# Patient Record
Sex: Female | Born: 1960 | Race: White | Hispanic: No | Marital: Married | Smoking: Never smoker
Health system: Southern US, Community
[De-identification: ages and names within clinical notes are randomized; demographics above are authoritative.]

## PROBLEM LIST (undated history)

## (undated) DIAGNOSIS — K121 Other forms of stomatitis: Secondary | ICD-10-CM

## (undated) DIAGNOSIS — R32 Unspecified urinary incontinence: Secondary | ICD-10-CM

## (undated) DIAGNOSIS — N766 Ulceration of vulva: Secondary | ICD-10-CM

## (undated) DIAGNOSIS — B019 Varicella without complication: Secondary | ICD-10-CM

## (undated) DIAGNOSIS — E669 Obesity, unspecified: Secondary | ICD-10-CM

## (undated) DIAGNOSIS — F419 Anxiety disorder, unspecified: Secondary | ICD-10-CM

## (undated) DIAGNOSIS — F329 Major depressive disorder, single episode, unspecified: Secondary | ICD-10-CM

## (undated) DIAGNOSIS — F32A Depression, unspecified: Secondary | ICD-10-CM

## (undated) DIAGNOSIS — K589 Irritable bowel syndrome without diarrhea: Secondary | ICD-10-CM

## (undated) DIAGNOSIS — R42 Dizziness and giddiness: Secondary | ICD-10-CM

## (undated) DIAGNOSIS — M352 Behcet's disease: Secondary | ICD-10-CM

## (undated) DIAGNOSIS — R51 Headache: Secondary | ICD-10-CM

## (undated) DIAGNOSIS — G43909 Migraine, unspecified, not intractable, without status migrainosus: Secondary | ICD-10-CM

## (undated) HISTORY — DX: Other forms of stomatitis: K12.1

## (undated) HISTORY — DX: Unspecified urinary incontinence: R32

## (undated) HISTORY — DX: Ulceration of vulva: N76.6

## (undated) HISTORY — DX: Behcet's disease: M35.2

## (undated) HISTORY — DX: Varicella without complication: B01.9

## (undated) HISTORY — DX: Obesity, unspecified: E66.9

## (undated) HISTORY — DX: Headache: R51

## (undated) HISTORY — DX: Migraine, unspecified, not intractable, without status migrainosus: G43.909

## (undated) HISTORY — DX: Anxiety disorder, unspecified: F41.9

## (undated) HISTORY — DX: Depression, unspecified: F32.A

## (undated) HISTORY — DX: Irritable bowel syndrome, unspecified: K58.9

## (undated) HISTORY — DX: Dizziness and giddiness: R42

## (undated) HISTORY — DX: Major depressive disorder, single episode, unspecified: F32.9

---
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
## 2012-02-04 HISTORY — PX: OTHER SURGICAL HISTORY: SHX169

## 2012-02-04 HISTORY — PX: LASIK: SHX215

## 2012-03-24 DIAGNOSIS — H5210 Myopia, unspecified eye: Secondary | ICD-10-CM | POA: Insufficient documentation

## 2012-03-26 DIAGNOSIS — Z9889 Other specified postprocedural states: Secondary | ICD-10-CM | POA: Insufficient documentation

## 2012-04-15 DIAGNOSIS — R9409 Abnormal results of other function studies of central nervous system: Secondary | ICD-10-CM | POA: Insufficient documentation

## 2012-04-16 ENCOUNTER — Other Ambulatory Visit: Payer: Self-pay | Admitting: Neurology

## 2012-04-16 DIAGNOSIS — R42 Dizziness and giddiness: Secondary | ICD-10-CM

## 2012-04-19 ENCOUNTER — Ambulatory Visit
Admission: RE | Admit: 2012-04-19 | Discharge: 2012-04-19 | Disposition: A | Discharge status: Home / Self Care | Payer: 59 | Source: Ambulatory Visit | Attending: Neurology | Admitting: Neurology

## 2012-04-19 VITALS — BP 93/52 | HR 84

## 2012-04-19 DIAGNOSIS — I776 Arteritis, unspecified: Secondary | ICD-10-CM

## 2012-04-19 LAB — CRYPTOCOCCAL ANTIGEN, CSF: Crypto Ag: NEGATIVE

## 2012-04-19 LAB — CSF CELL COUNT WITH DIFFERENTIAL
RBC Count, CSF: 0 cu mm
WBC, CSF: 0 cu mm (ref 0–5)

## 2012-04-19 LAB — PROTEIN, CSF: Total Protein, CSF: 37 mg/dL (ref 15–45)

## 2012-04-19 NOTE — Progress Notes (Signed)
Patient ID: Samantha Pope, female   DOB: November 01, 1960, 52 y.o.   MRN: [id number] Patient had a lumbar puncture earlier today.  Complains of headache, 4/10.  The headache continues when she lays down.  I explained to her that a headache is not unexpected after a lumbar puncture.  Recommend bed rest and continue hydration.  She will call us if the headache persists tomorrow.

## 2012-04-19 NOTE — Progress Notes (Signed)
One tube blood drawn per orders from right [facility] space without difficulty; site unremarkable.  jkl

## 2012-04-20 ENCOUNTER — Telehealth: Payer: Self-pay | Admitting: Neurology

## 2012-04-20 ENCOUNTER — Telehealth: Payer: Self-pay | Admitting: Radiology

## 2012-04-20 ENCOUNTER — Telehealth: Payer: Self-pay | Admitting: *Deleted

## 2012-04-20 NOTE — Telephone Encounter (Signed)
Pt had headache post LP but after 24 hours of bedrest and getting up no headache. Explained she may stay up, but no heavy activities today. If does not develop headache she does not need to lay down for another 24 hours.

## 2012-04-20 NOTE — Telephone Encounter (Signed)
I have already called the patient earlier today. All of the spinal fluid results are not available. So far, the cells, protein, and glucose were normal. Opening pressure was normal.

## 2012-04-20 NOTE — Telephone Encounter (Signed)
Patient called wanting know what is next after the LP.

## 2012-04-20 NOTE — Telephone Encounter (Signed)
Pt called and is asking about LP results.  Speak to Dr. Starling Manns also [phone] (or [phone]). (he was trying to get more information).

## 2012-04-20 NOTE — Telephone Encounter (Signed)
I called patient. The lumbar puncture so far looks unremarkable. Opening pressure was normal, there is no cells, or elevated protein. The patient likely does not have a vasculitis, and she certainly does not have any meningitis from Behcet's disease. 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 No specific treatment. The patient may return to work whenever the dizziness has improved to the point where she can operate a motor vehicle.

## 2012-04-22 LAB — CNS IGG SYNTHESIS RATE, CSF+BLOOD
IgG, CSF: 1.9 mg/dL (ref 0.8–7.7)
MS CNS IgG Synthesis Rate: -2.8 mg/24 h (ref <=3.3)

## 2012-04-23 ENCOUNTER — Telehealth: Payer: Self-pay | Admitting: Neurology

## 2012-04-23 NOTE — Telephone Encounter (Signed)
I called patient. The spinal fluid evaluation appear to be normal. I believe that the patient has a viral vestibulitis, and that over time that she will get better. No evidence of a meningitis, vasculitis, or encephalitis.

## 2012-04-24 LAB — OLIGOCLONAL BANDS, CSF + SERM

## 2012-05-27 ENCOUNTER — Telehealth: Payer: Self-pay | Admitting: *Deleted

## 2012-05-27 ENCOUNTER — Encounter: Payer: Self-pay | Admitting: Family Medicine

## 2012-05-27 ENCOUNTER — Ambulatory Visit (INDEPENDENT_AMBULATORY_CARE_PROVIDER_SITE_OTHER): Payer: 59 | Admitting: Family Medicine

## 2012-05-27 VITALS — BP 102/74 | HR 108 | Temp 98.2°F | Ht 67.0 in | Wt 195.0 lb

## 2012-05-27 DIAGNOSIS — G43909 Migraine, unspecified, not intractable, without status migrainosus: Secondary | ICD-10-CM

## 2012-05-27 DIAGNOSIS — H9193 Unspecified hearing loss, bilateral: Secondary | ICD-10-CM

## 2012-05-27 DIAGNOSIS — Z1211 Encounter for screening for malignant neoplasm of colon: Secondary | ICD-10-CM

## 2012-05-27 DIAGNOSIS — H8123 Vestibular neuronitis, bilateral: Secondary | ICD-10-CM

## 2012-05-27 DIAGNOSIS — H8110 Benign paroxysmal vertigo, unspecified ear: Secondary | ICD-10-CM

## 2012-05-27 DIAGNOSIS — M352 Behcet's disease: Secondary | ICD-10-CM

## 2012-05-27 DIAGNOSIS — H9190 Unspecified hearing loss, unspecified ear: Secondary | ICD-10-CM

## 2012-05-27 DIAGNOSIS — H8120 Vestibular neuronitis, unspecified ear: Secondary | ICD-10-CM

## 2012-05-27 DIAGNOSIS — K589 Irritable bowel syndrome without diarrhea: Secondary | ICD-10-CM

## 2012-05-27 MED ORDER — TOPIRAMATE 25 MG PO TABS: 25.0000 mg | ORAL_TABLET | Freq: Every day | ORAL | Status: DC

## 2012-05-27 NOTE — Patient Instructions (Addendum)
-  We have ordered labs or studies at this visit. It can take up to 1-2 weeks for results and processing. We will contact you with instructions IF your results are abnormal. Normal results will be released to your [facility]. If you have not heard from [place] or can not find your results in [facility] in 2 weeks please contact our office.  -PLEASE SIGN UP FOR MYCHART TODAY   We recommend the following healthy lifestyle measures: - eat a healthy diet consisting of lots of vegetables, fruits, beans, nuts, seeds, healthy meats such as white chicken and fish and whole grains.  - avoid fried foods, fast food, processed foods, sodas, red meet and other fattening foods.  - get a least 150 minutes of aerobic exercise per week.   -We placed a referral for you as discussed to the rheumatologist for your Behcet's, to the gastroenterologist for your colonoscopy and to ENT per your request. It usually takes about 1-2 weeks to process and schedule this referral. If you have not heard from [place] regarding this appointment in 2 weeks please contact our office.  -please schedule your mammogram  -please have any labs from your specialists faxed to me  Follow up in: 3-4 months or at your convenience for your physical exam with pap smear

## 2012-05-27 NOTE — Telephone Encounter (Signed)
This patient may need a revisit within the next 4-6 weeks. Thank you.

## 2012-05-27 NOTE — Progress Notes (Addendum)
Chief Complaint  Patient presents with  . Establish Care  . Dizziness    started feb 26    HPI:  Samantha Pope is here to establish care. Recently moved from [place] - moving since 2011. She is close to retirement. Last PCP and physical:  Has the following chronic problems and concerns today:  There is no problem list on file for this patient.  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 Starling Manns -on imuran, trental and predisone -would like to establish with rheumatology here - will see Dr. Starling Manns next week - sees him 4 times per year for labs  IBS (dx 1997): -hyocosamine  Wants to see ENT: -mild hearing loss chronic -mild chronic tinnitus and vertigo  Other providers: Integrative medicine - Dr. Adair Laundry in [facility] Rheumatology - Dr. Starling Manns Neurology -  Dr. Anne Hahn  Health Maintenance: -last physical was in may or June 2013 - normal pap (remote mild abnormal pap), mammogram dec 2012 -needs colonoscopy, had sigmoid 1997 - normal -UTD on tdap, get flu vaccine most years  ROS: See pertinent positives and negatives per HPI.  Past Medical History  Diagnosis Date  . Chicken pox   . Depression   . Headache     frequent  . Mouth ulcer   . Genital ulcer, female   . Migraines   . Urine incontinence   . IBS (irritable bowel syndrome)   . Behcet's syndrome   . Vertigo     Family History  Problem Relation Age of Onset  . Arthritis    . Prostate cancer Father   . Heart disease Father     a. fib  . Parkinson's disease Father   . Alcoholism Brother   . Colon cancer Paternal Grandfather 8  . Stroke Paternal Grandmother     in 76s    History   Social History  . Marital Status: Married    Spouse Name: N/A    Number of Children: N/A  . Years of Education:  N/A   Social History Main Topics  . Smoking status: Never Smoker   . Smokeless tobacco: None  . Alcohol Use: No  . Drug Use: None  . Sexually Active: Yes     Comment: with husabnd   Other Topics Concern  . 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, married daughters and grandsons      Spiritual Beliefs: mormon      Lifestyle: no regular exercise, diet is poor             Current outpatient prescriptions:azaTHIOprine (IMURAN) 50 MG tablet, , Disp: , Rfl: ;  Cholecalciferol (D3 DOTS) 2000 UNITS TBDP, Take by mouth., Disp: , Rfl: ;  Coenzyme Q10 (CO Q 10) 10 MG CAPS, Take 100 mg by mouth every morning., Disp: , Rfl: ;  fish oil-omega-3 fatty acids 1000 MG capsule, Take 2 g by mouth 2 (two) times daily., Disp: , Rfl: ;  hyoscyamine (LEVBID) 0.375 MG 12 hr tablet, , Disp: , Rfl:  Multiple Vitamin (MULTIVITAMIN) tablet, Take 1 tablet by mouth daily., Disp: , Rfl: ;  naproxen sodium (ANAPROX) 550 MG tablet, Take 550 mg by  mouth 2 (two) times daily with a meal., Disp: , Rfl: ;  pentoxifylline (TRENTAL) 400 MG CR tablet, Take 400 mg by mouth 2 (two) times daily., Disp: , Rfl: ;  predniSONE (DELTASONE) 20 MG tablet, , Disp: , Rfl: ;  Probiotic Product (PROBIOTIC DAILY PO), Take by mouth. Florify 5 billion cfu, Disp: , Rfl:  RESTASIS 0.05 % ophthalmic emulsion, , Disp: , Rfl: ;  Rhubarb EXTR, 4 mg by Does not apply route daily., Disp: , Rfl: ;  SUMAtriptan (IMITREX) 100 MG tablet, Take 100 mg by mouth every 2 (two) hours as needed for migraine., Disp: , Rfl:   EXAM:  Filed Vitals:   05/27/12 0828  BP: 102/74  Pulse: 108  Temp: 98.2 F (36.8 C)    Body mass index is 30.53 kg/(m^2).  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 -We provided refills for any medications we will prescribe as needed. -We addressed current concerns per orders and patient instructions. -We have asked for records for pertinent exams, studies, vaccines and notes from previous providers. -She reports she has had extensive labs with rheum and prior PCP recently and will have labs next week as well with rheum - she will have these faxed to me ->45 minutes spent face to face with this patient -follow up in June or July for physical with pap  -Patient advised to return or notify a doctor immediately if symptoms worsen or persist or new concerns arise.  Patient Instructions  -We have ordered labs or studies at this visit. It can take up to 1-2 weeks for results and processing. We will contact you with instructions IF your results are abnormal. Normal results will be released to your [facility]. If you have not heard from [place] or can not find your results in [facility] in 2 weeks please contact our office.  -PLEASE SIGN UP FOR MYCHART TODAY   We recommend the following healthy lifestyle measures: - eat a healthy diet consisting of lots of vegetables, fruits, beans, nuts, seeds, healthy meats such as white chicken  and fish and whole grains.  - avoid fried foods, fast food, processed foods, sodas, red meet and other fattening foods.  - get a least 150 minutes of aerobic exercise per week.   -We placed a referral for you as discussed to the rheumatologist for your Behcet's, to the gastroenterologist for your colonoscopy and to ENT per your request. It usually takes about 1-2 weeks to process and schedule this referral. If you have not heard from [place] regarding this appointment in 2 weeks please contact our office.  -please schedule your mammogram  -please have any labs from your specialists faxed to me  Follow up in: 3-4 months or at your convenience  for your physical exam with pap smear      Yasser Hepp R.   Recent OV notes from Dr. Anne Hahn and Dr. Starling Manns reviewed. MRI and LP ok. Has follow up with both specialist. Dx vestibular neuritis.

## 2012-05-27 NOTE — Telephone Encounter (Signed)
I called the patient. The patient has had ongoing problems with dizziness, but she is now having some headaches. The vertigo is associated with any head movement. I'll start the patient on Topamax, and I will get her into vestibular rehabilitation.

## 2012-05-27 NOTE — Telephone Encounter (Signed)
Patient called stating she is still having vertigo symptoms and would like to speak with physician.

## 2012-05-28 ENCOUNTER — Encounter: Payer: Self-pay | Admitting: Internal Medicine

## 2012-06-01 ENCOUNTER — Encounter: Payer: Self-pay | Admitting: Family Medicine

## 2012-06-03 ENCOUNTER — Other Ambulatory Visit: Payer: Self-pay

## 2012-06-03 ENCOUNTER — Encounter: Payer: Self-pay | Admitting: Family Medicine

## 2012-06-03 DIAGNOSIS — Z1231 Encounter for screening mammogram for malignant neoplasm of breast: Secondary | ICD-10-CM

## 2012-06-03 NOTE — Progress Notes (Signed)
Received OV notes from Dr. Starling Manns - prior rheumatologist.  Last OV 06/03/11. 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 Migraines. Topamax for HAs. Imuran, trental. Labs. Scanned in.

## 2012-06-04 DIAGNOSIS — Z0289 Encounter for other administrative examinations: Secondary | ICD-10-CM

## 2012-06-08 ENCOUNTER — Ambulatory Visit: Payer: 59 | Attending: Neurology | Admitting: Rehabilitative and Restorative Service Providers"

## 2012-06-08 DIAGNOSIS — H8110 Benign paroxysmal vertigo, unspecified ear: Secondary | ICD-10-CM | POA: Insufficient documentation

## 2012-06-08 DIAGNOSIS — R269 Unspecified abnormalities of gait and mobility: Secondary | ICD-10-CM | POA: Insufficient documentation

## 2012-06-08 DIAGNOSIS — IMO0001 Reserved for inherently not codable concepts without codable children: Secondary | ICD-10-CM | POA: Insufficient documentation

## 2012-06-11 ENCOUNTER — Ambulatory Visit: Payer: 59 | Admitting: Rehabilitative and Restorative Service Providers"

## 2012-06-14 ENCOUNTER — Encounter: Payer: Self-pay | Admitting: Family Medicine

## 2012-06-14 ENCOUNTER — Telehealth: Payer: Self-pay

## 2012-06-14 NOTE — Telephone Encounter (Signed)
Per Dr. Elmyra Ricks request called pt to advise that she could pick up her x-rays from her last mammograms.  Left a detailed message for pt.

## 2012-06-14 NOTE — Progress Notes (Signed)
Received some remote labs and mammogram from >5 years ago from [facility]. No useful, pertinent or recent data.

## 2012-06-15 ENCOUNTER — Telehealth: Payer: Self-pay | Admitting: Neurology

## 2012-06-15 NOTE — Telephone Encounter (Signed)
I called Samantha Pope and notified her that disability forms have been completed and faxed to [facility], per her request.

## 2012-06-18 ENCOUNTER — Telehealth: Payer: Self-pay

## 2012-06-18 ENCOUNTER — Ambulatory Visit: Payer: 59 | Admitting: Rehabilitative and Restorative Service Providers"

## 2012-06-18 NOTE — Telephone Encounter (Signed)
Per Dr. Elmyra Ricks request call pt and advised office note can be faxed to [employer] [facility] but Dr. Selena Batten did not refer patient for vestibular rehab neurology would treat this.    Called and spoke with pt and pt is aware.  Pt states she would still like last ov sent to [employer].  Pt states she has not seen neurology and she will contact them.

## 2012-06-21 ENCOUNTER — Ambulatory Visit: Payer: 59 | Admitting: Rehabilitative and Restorative Service Providers"

## 2012-06-21 ENCOUNTER — Telehealth: Payer: Self-pay | Admitting: Neurology

## 2012-06-22 NOTE — Telephone Encounter (Signed)
Sent for scan

## 2012-06-22 NOTE — Telephone Encounter (Signed)
Paper work faxed to [employer].

## 2012-06-24 ENCOUNTER — Ambulatory Visit: Payer: 59 | Admitting: Rehabilitative and Restorative Service Providers"

## 2012-06-30 ENCOUNTER — Ambulatory Visit: Payer: 59 | Admitting: Rehabilitative and Restorative Service Providers"

## 2012-07-02 ENCOUNTER — Encounter: Payer: Self-pay | Admitting: Family Medicine

## 2012-07-02 NOTE — Progress Notes (Signed)
Received some notes and labs from Dr. Judi Cong, [facility] in [place]. Pt prescribed a number of supplements and hormones in the past. Scanned in.

## 2012-07-06 ENCOUNTER — Encounter: Payer: Self-pay | Admitting: Neurology

## 2012-07-06 ENCOUNTER — Ambulatory Visit: Payer: 59 | Attending: Neurology | Admitting: Rehabilitative and Restorative Service Providers"

## 2012-07-06 DIAGNOSIS — H8110 Benign paroxysmal vertigo, unspecified ear: Secondary | ICD-10-CM | POA: Insufficient documentation

## 2012-07-06 DIAGNOSIS — IMO0001 Reserved for inherently not codable concepts without codable children: Secondary | ICD-10-CM | POA: Insufficient documentation

## 2012-07-06 DIAGNOSIS — R269 Unspecified abnormalities of gait and mobility: Secondary | ICD-10-CM | POA: Insufficient documentation

## 2012-07-07 ENCOUNTER — Telehealth: Payer: Self-pay | Admitting: Neurology

## 2012-07-07 NOTE — Telephone Encounter (Signed)
I called the patient. The patient still having some dizziness. The dizziness was better last week, a little bit worse today. The patient is also having daily headaches that she associates with certain activities such as walking or driving. The patient is trying to get her disability forms filled out due to the ongoing dizziness. I've asked her to go up on the Topamax taking 50 mg at night.

## 2012-07-07 NOTE — Telephone Encounter (Signed)
Patient called stating she is having dizziness and headache and she questions concerning her treatment.

## 2012-07-08 ENCOUNTER — Encounter: Payer: Self-pay | Admitting: Family Medicine

## 2012-07-08 ENCOUNTER — Ambulatory Visit (INDEPENDENT_AMBULATORY_CARE_PROVIDER_SITE_OTHER): Payer: 59 | Admitting: Family Medicine

## 2012-07-08 VITALS — BP 98/70 | Temp 98.2°F | Wt 201.0 lb

## 2012-07-08 DIAGNOSIS — G43909 Migraine, unspecified, not intractable, without status migrainosus: Secondary | ICD-10-CM

## 2012-07-08 DIAGNOSIS — K589 Irritable bowel syndrome without diarrhea: Secondary | ICD-10-CM | POA: Insufficient documentation

## 2012-07-08 DIAGNOSIS — F329 Major depressive disorder, single episode, unspecified: Secondary | ICD-10-CM | POA: Insufficient documentation

## 2012-07-08 DIAGNOSIS — F419 Anxiety disorder, unspecified: Secondary | ICD-10-CM | POA: Insufficient documentation

## 2012-07-08 DIAGNOSIS — R42 Dizziness and giddiness: Secondary | ICD-10-CM

## 2012-07-08 DIAGNOSIS — M352 Behcet's disease: Secondary | ICD-10-CM

## 2012-07-08 DIAGNOSIS — F341 Dysthymic disorder: Secondary | ICD-10-CM

## 2012-07-08 NOTE — Progress Notes (Signed)
No chief complaint on file.   HPI:  52 yo female with PMH Behcet's (referred to rheuma), Headaches and vertigo (followed by neuro) and a hx of depression her for acute visit for depression. -depression worse recently, feels like memory is not where it needs to be, feels like cognition is not all there - feels like in a fog, anxious a lot, sleep is poor, has trouble remembering things, feels like coping mechanisms have been failing, reports kids have told her she has ADD, feels like mood is unstable, cries frequently, depressed mood daily -she has seen counselor in the past and wants to see psych in the area -denies: SI, thoughts of self harm -still having frequent headaches and intermittent dizziness with sudden movements - she spoke with her neurologist yesterday and doing trial of double dose of her topamax, and she is undergoing vestibular rehab and her neruologist is filling out her paperwork for the vestibular neuritis for disability -she missed recent appt with rheum here, but sees rheum in [place] this week and will have labs done there, sees rheum here in a few weeks  Health Maintenance: -mammo in June scheduled -colonoscopy in July scheduled  ROS: See pertinent positives and negatives per HPI.  Past Medical History  Diagnosis Date  . Chicken pox   . Depression   . Headache(784.0)     frequent  . Mouth ulcer   . Genital ulcer, female   . Migraines   . Urine incontinence   . IBS (irritable bowel syndrome)   . Behcet's syndrome   . Vertigo     Family History  Problem Relation Age of Onset  . Arthritis    . Prostate cancer Father   . Heart disease Father     a. fib  . Parkinson's disease Father   . Alcoholism Brother   . Colon cancer Paternal Grandfather 21  . Stroke Paternal Grandmother     in 5s    History   Social History  . Marital Status: Married    Spouse Name: N/A    Number of Children: N/A  . Years of Education: N/A   Social History Main Topics  .  Smoking status: Never Smoker   . Smokeless tobacco: None  . Alcohol Use: No  . Drug Use: None  . Sexually Active: Yes     Comment: with husabnd   Other Topics Concern  . 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, married daughters and grandsons      Spiritual Beliefs: mormon      Lifestyle: no regular exercise, diet is poor             Current outpatient prescriptions:azaTHIOprine (IMURAN) 50 MG tablet, , Disp: , Rfl: ;  Cholecalciferol (D3 DOTS) 2000 UNITS TBDP, Take by mouth., Disp: , Rfl: ;  Coenzyme Q10 (CO Q 10) 10 MG CAPS, Take 100 mg by mouth every morning., Disp: , Rfl: ;  fish oil-omega-3 fatty acids 1000 MG capsule, Take 2 g by mouth 2 (two) times daily., Disp: , Rfl: ;  hyoscyamine (LEVBID) 0.375 MG 12 hr tablet, , Disp: , Rfl:  Multiple Vitamin (MULTIVITAMIN) tablet, Take 1 tablet by mouth daily., Disp: , Rfl: ;  naproxen sodium (ANAPROX) 550 MG tablet, Take 550 mg by mouth 2 (two) times daily with a meal., Disp: ,  Rfl: ;  pentoxifylline (TRENTAL) 400 MG CR tablet, Take 400 mg by mouth every evening. , Disp: , Rfl: ;  predniSONE (DELTASONE) 20 MG tablet, Take 17.5 mg by mouth daily. , Disp: , Rfl:  Probiotic Product (PROBIOTIC DAILY PO), Take by mouth. Florify 5 billion cfu, Disp: , Rfl: ;  RESTASIS 0.05 % ophthalmic emulsion, , Disp: , Rfl: ;  Rhubarb EXTR, 4 mg by Does not apply route daily., Disp: , Rfl: ;  SUMAtriptan (IMITREX) 100 MG tablet, Take 100 mg by mouth every 2 (two) hours as needed for migraine., Disp: , Rfl: ;  topiramate (TOPAMAX) 25 MG tablet, Take 50 mg by mouth at bedtime., Disp: , Rfl:   EXAM:  Filed Vitals:   07/08/12 1508  BP: 98/70  Temp: 98.2 F (36.8 C)    Body mass index is 31.47 kg/(m^2).  GENERAL: vitals reviewed and listed above, alert, oriented, appears well hydrated and in no acute  distress  HEENT: atraumatic, conjunttiva clear, no obvious abnormalities on inspection of external nose and ears  MS: moves all extremities without noticeable abnormality  PSYCH: pleasant and cooperative, depressed mood, flat affect, tearful  ASSESSMENT AND PLAN:  Discussed the following assessment and plan:  Anxiety and depression -obvious depression, no thoughts of self harm or SI -discussed tx options -she would prefer to see psych - information provided to call for psych and counseling -return precautions  Migraine -followed by her neurologist, recent increase in Topamax  Vertigo -followed by neurology and has had imaging and LP, told vestibular neuritis, undergoing vestibular rehab -ask her to have any changes in meds, tests and OV faxed to [place] -she has been somewhat frustrated with ability to have appts with her neurologist and chronicity of HAs and dizziness. Offered referral to neuro at medical center - she deferred this for now and will continue with current neurologist.   IBS (irritable bowel syndrome)  Behcet's syndrome -has follow up with rheum in [place] this week with lab work, establishing with rheum here later this month -asked her to have any labs, OV, changes in medication faxed to [place]   -Patient advised to return or notify a doctor immediately if symptoms worsen or persist or new concerns arise.  There are no Patient Instructions on file for this visit.   Kriste Basque R.

## 2012-07-09 ENCOUNTER — Ambulatory Visit: Payer: 59 | Admitting: Rehabilitative and Restorative Service Providers"

## 2012-07-12 ENCOUNTER — Ambulatory Visit: Payer: 59 | Admitting: Rehabilitative and Restorative Service Providers"

## 2012-07-13 ENCOUNTER — Encounter: Payer: Self-pay | Admitting: Family Medicine

## 2012-07-13 ENCOUNTER — Telehealth: Payer: Self-pay | Admitting: Neurology

## 2012-07-13 NOTE — Telephone Encounter (Signed)
I spoke to patient and she faxed over more forms on 06-22-12 and then again yesterday.  I don't see that we have received these forms.  She will fax tomorrow to our direct fax and I will check with medical records to see if these have been received.

## 2012-07-13 NOTE — Progress Notes (Signed)
Annual exam on 09/2011 from [facility], [place], [place]. No labs or studies done. Report in OV note UTD with mammo and pap as of 2012 and last tetanus 2010. Per  OV notes, pt had just changed PCP as unhappy with prior office.

## 2012-07-13 NOTE — Telephone Encounter (Signed)
Patient is calling back again to tell us how important it is she gets her disability paper work faxed in today.  She's had contact with our practice and states we have the fax number and the information which should go to her disability case manager.  You can reach the patient at [phone]. Patient seems upset and would like a call back today if at all possible,

## 2012-07-14 ENCOUNTER — Telehealth: Payer: Self-pay | Admitting: Neurology

## 2012-07-14 NOTE — Telephone Encounter (Signed)
Forms faxed and received this am, placed on Lupita Leash RN's desk.

## 2012-07-14 NOTE — Telephone Encounter (Signed)
I spoke to patient and told her I received forms.

## 2012-07-15 ENCOUNTER — Ambulatory Visit (INDEPENDENT_AMBULATORY_CARE_PROVIDER_SITE_OTHER): Payer: Managed Care, Other (non HMO) | Admitting: Licensed Clinical Social Worker

## 2012-07-15 DIAGNOSIS — F331 Major depressive disorder, recurrent, moderate: Secondary | ICD-10-CM

## 2012-07-15 DIAGNOSIS — F411 Generalized anxiety disorder: Secondary | ICD-10-CM

## 2012-07-16 ENCOUNTER — Ambulatory Visit: Payer: 59 | Admitting: Rehabilitative and Restorative Service Providers"

## 2012-07-16 ENCOUNTER — Telehealth: Payer: Self-pay | Admitting: Neurology

## 2012-07-16 NOTE — Telephone Encounter (Signed)
I left a follow up voice message for patient: The initial physician statement is dated 04/02/2012. We did nto see patient prior to March 2014. Please contact Mickey Farber, RN with specifics about that you are requesting. I continued, the letter patient sent is asking that we contact [employer] [facility] but, there in no phone number, only fax numbers. Please call and leave a message with Ms. McCaw, RN re: Counsellor.

## 2012-07-16 NOTE — Telephone Encounter (Signed)
The patient faxed over a letter with her disability asking numerous questions about her medicine and condition.  She said the doctor told her to increase her Topamax 25mg  to 3 or 4 pills.  She read up on Topamax and about side effects such as confusion, concentration, depression etc.  She said that during her illness she has had cognitive impairment, with slowness of thought, speech, but recently  weepy, upset, forgetfulness, tired and many more.   The downturn started 3-4 weeks after starting the Topamax. She is concerned that the Topamax may be causing more harm than good.  She asked:  Is there another way to treat these headaches? What is the safe way to taper off the Topamax?  Is there anyway to know if the medication is causing the symptoms or if there is something else going on?   Her number [phone].

## 2012-07-19 ENCOUNTER — Telehealth: Payer: Self-pay | Admitting: Neurology

## 2012-07-19 ENCOUNTER — Ambulatory Visit
Admission: RE | Admit: 2012-07-19 | Discharge: 2012-07-19 | Disposition: A | Discharge status: Home / Self Care | Payer: 59 | Source: Ambulatory Visit

## 2012-07-19 DIAGNOSIS — Z1231 Encounter for screening mammogram for malignant neoplasm of breast: Secondary | ICD-10-CM

## 2012-07-19 NOTE — Telephone Encounter (Signed)
I spoke to patient and she finished her Topamax last night, was taking 50mg  a day.  She wants to know if she should have it refilled, or was she to taper off.  Also please see note from below with all the questions about Topamax.  I also told her we will not fill out the disability paperwork until she is seen again.  [phone].

## 2012-07-19 NOTE — Telephone Encounter (Signed)
I called patient. The patient indicates that she is not gaining much benefit with the Topamax. She is to go off the medication, and we will see her back in office in several weeks.

## 2012-07-19 NOTE — Telephone Encounter (Signed)
States she is returning [employer].   She asks that you call Milus Banister at [phone], she is with [employer].  Wouldn't give details on call states that Lupita Leash will know.

## 2012-07-20 ENCOUNTER — Ambulatory Visit: Payer: 59 | Admitting: Rehabilitative and Restorative Service Providers"

## 2012-07-20 ENCOUNTER — Ambulatory Visit (INDEPENDENT_AMBULATORY_CARE_PROVIDER_SITE_OTHER): Payer: 59 | Admitting: Family Medicine

## 2012-07-20 ENCOUNTER — Encounter: Payer: Self-pay | Admitting: Family Medicine

## 2012-07-20 VITALS — BP 102/72 | Temp 98.8°F | Wt 196.0 lb

## 2012-07-20 DIAGNOSIS — F329 Major depressive disorder, single episode, unspecified: Secondary | ICD-10-CM

## 2012-07-20 DIAGNOSIS — G44209 Tension-type headache, unspecified, not intractable: Secondary | ICD-10-CM

## 2012-07-20 DIAGNOSIS — F3289 Other specified depressive episodes: Secondary | ICD-10-CM

## 2012-07-20 NOTE — Progress Notes (Addendum)
Chief Complaint  Patient presents with  . Headache    fagitue    HPI:  Acute visit for headache: -long hx headache, fatigue, depression, migraines - followed by neurology and rheum, optho and integrative medical doctor -having tension headache that started today, has had headaches frequently for many months followed by neurologist and on disability for this and ? Vestibular neuritis for several months -reports stopped her topamax yesterday because she didn't think it helped- reports told neurologist about this, has appt in July for follow up with neuro -pt is wanting continued disability for her headache because feels like thinking wears her out and makes her head hurt - but reports neurologist does not feel headaches warrant continued disability -has tried naproxen, Excedrin migraine, Topamax, Imitrex (some helped but doesn't like side effects) - reluctant to try other medications, headaches unchanged -she also struggles with depression - recently seen for this and refused tx here for this and preferred to see psych - was provided info to schedule appt with pscyh, denied SI, self harm  ROS: See pertinent positives and negatives per HPI.  Past Medical History  Diagnosis Date  . Chicken pox   . Depression   . Headache(784.0)     frequent  . Mouth ulcer   . Genital ulcer, female   . Migraines   . Urine incontinence   . IBS (irritable bowel syndrome)   . Behcet's syndrome   . Vertigo     Family History  Problem Relation Age of Onset  . Arthritis    . Prostate cancer Father   . Heart disease Father     a. fib  . Parkinson's disease Father   . Alcoholism Brother   . Colon cancer Paternal Grandfather 36  . Stroke Paternal Grandmother     in 62s    History   Social History  . Marital Status: Married    Spouse Name: N/A    Number of Children: N/A  . Years of Education: N/A   Social History Main Topics  . Smoking status: Never Smoker   . Smokeless tobacco: None  .  Alcohol Use: No  . Drug Use: None  . Sexually Active: Yes     Comment: with husabnd   Other Topics Concern  . 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, married daughters and grandsons      Spiritual Beliefs: mormon      Lifestyle: no regular exercise, diet is poor             Current outpatient prescriptions:azaTHIOprine (IMURAN) 50 MG tablet, , Disp: , Rfl: ;  Cholecalciferol (D3 DOTS) 2000 UNITS TBDP, Take by mouth., Disp: , Rfl: ;  Coenzyme Q10 (CO Q 10) 10 MG CAPS, Take 100 mg by mouth every morning., Disp: , Rfl: ;  fish oil-omega-3 fatty acids 1000 MG capsule, Take 2 g by mouth 2 (two) times daily., Disp: , Rfl: ;  hyoscyamine (LEVBID) 0.375 MG 12 hr tablet, , Disp: , Rfl:  Multiple Vitamin (MULTIVITAMIN) tablet, Take 1 tablet by mouth daily., Disp: , Rfl: ;  naproxen sodium (ANAPROX) 550 MG tablet, Take 550 mg by mouth 2 (two) times daily with a meal., Disp: , Rfl: ;  pentoxifylline (TRENTAL) 400 MG CR tablet, Take 400 mg by mouth every evening. , Disp: , Rfl: ;  predniSONE (DELTASONE) 20 MG tablet, Take 17.5 mg by mouth daily. , Disp: , Rfl:  Probiotic Product (PROBIOTIC DAILY PO), Take by mouth. Florify 5 billion cfu, Disp: , Rfl: ;  RESTASIS 0.05 % ophthalmic emulsion, , Disp: , Rfl: ;  Rhubarb EXTR, 4 mg by Does not apply route daily., Disp: , Rfl: ;  SUMAtriptan (IMITREX) 100 MG tablet, Take 100 mg by mouth every 2 (two) hours as needed for migraine., Disp: , Rfl:   EXAM:  Filed Vitals:   07/20/12 0954  BP: 102/72  Temp: 98.8 F (37.1 C)    Body mass index is 30.69 kg/(m^2).  GENERAL: vitals reviewed and listed above, alert, oriented, appears well hydrated and in no acute distress  HEENT: atraumatic, conjunttiva clear, no obvious abnormalities on inspection of external nose and ears  NECK: no obvious masses on  inspection  LUNGS: clear to auscultation bilaterally, no wheezes, rales or rhonchi, good air movement  CV: HRRR, no peripheral edema  MS: moves all extremities without noticeable abnormality  PSYCH: pleasant and cooperative, depressed mood - tearful when I agreed with neurology recs regarding continued disability.  NEURO: CN II-XII grossly intact, PERRLA, finger to nose normal, normal gait  ASSESSMENT AND PLAN:  Discussed the following assessment and plan:  Tension headache/migraines/dizziness -seems unhappy with tx per neuro, not happy they have not recommended long term disability for this -I advised would not override neurology decision and feel getting back to usual activities/work may actually help as being out of work has not seemed to help -she is not willing to try other suggestions I made for prophylactic tx for migraines/tension HA (BB, TCA, etc) -[facility], perhaps other options, she is not interested in this -provided CAM options, supplement info (butterbur, feverfew, etc - risks/benefits, discussed supplements not regulated in the [place]) -advised follow up with neuro  Depression -she is to see psych about her depression as not interested in options I presented at last visit. I feel she needs psych care and counseling. -I feel getting back to usual activities and work will be helpful  have asked her to have labs/studies sent to [place] when seen by her specialists -advised her to contact us for any other concerns -Patient advised to return or notify a doctor immediately if symptoms worsen or persist or new concerns arise. >25 minutes spent face to face with this patient  Note: I do not feel I have been able to help this patient much as she is reluctant to follow any recommendations I have She seems primarily interested in getting long term disability, but I do not feel this is warranted nor that being out of work has helped her.  I actually feel being out of work may worsen and prolong her depression and other issues.   Patient Instructions  Chauncey Mann: Phone: [phone] Address: [address], [place], [place] [address]  [facility]: [address] [place], [place] [address] [phone]  Dr. Mia Creek [facility] [address] [place], [place] [phone]   Follow up with your neurologist as scheduled for your headaches      Terressa Koyanagi.

## 2012-07-20 NOTE — Patient Instructions (Addendum)
Chauncey Mann: Phone: [phone] Address: [address], [place], [place] [address]  [facility]: [address] [place], [place] [address] [phone]  Dr. Mia Creek [facility] [address] [place], [place] [phone]   Follow up with your neurologist as scheduled for your headaches

## 2012-07-23 ENCOUNTER — Ambulatory Visit: Payer: 59 | Admitting: Rehabilitative and Restorative Service Providers"

## 2012-07-23 ENCOUNTER — Encounter: Payer: Self-pay | Admitting: Family Medicine

## 2012-07-23 NOTE — Progress Notes (Signed)
Received some OV notes from prior doctor, Dr. Deitra Mayo. Appears had frequent visits in 2013 for migraines and IBS, requesting disability, notes handwritten and difficult to read - unclear whether or not on disability. Also received labs from 05/2012 including CBC, cortisol, CRP, homocysteine, cardiolipin antibodies - all normal. Also CBC and CMp and lipids from 2013 looked ok.  Pap reports from 12/2010 and 12/2009 normal.  Scanned in.

## 2012-07-27 ENCOUNTER — Encounter: Payer: 59 | Admitting: Rehabilitative and Restorative Service Providers"

## 2012-07-30 ENCOUNTER — Ambulatory Visit: Payer: 59 | Admitting: Rehabilitative and Restorative Service Providers"

## 2012-08-03 ENCOUNTER — Encounter: Payer: Self-pay | Admitting: Family Medicine

## 2012-08-03 NOTE — Progress Notes (Signed)
Rheum notes from OV with Dr. Dierdre Forth on 08/02/12 received and reviewed. Stable behcet's. F/u with Dr. Dierdre Forth in 2 months. Scanned in.

## 2012-08-09 ENCOUNTER — Encounter: Payer: 59 | Admitting: Internal Medicine

## 2012-08-12 ENCOUNTER — Encounter: Payer: Self-pay | Admitting: Internal Medicine

## 2012-08-26 ENCOUNTER — Encounter: Payer: Self-pay | Admitting: Family Medicine

## 2012-08-26 ENCOUNTER — Encounter: Payer: Self-pay | Admitting: Neurology

## 2012-08-26 ENCOUNTER — Other Ambulatory Visit (HOSPITAL_COMMUNITY)
Admission: RE | Admit: 2012-08-26 | Discharge: 2012-08-26 | Disposition: A | Discharge status: Home / Self Care | Payer: 59 | Source: Ambulatory Visit | Attending: Family Medicine | Admitting: Family Medicine

## 2012-08-26 ENCOUNTER — Ambulatory Visit (INDEPENDENT_AMBULATORY_CARE_PROVIDER_SITE_OTHER): Payer: 59 | Admitting: Family Medicine

## 2012-08-26 VITALS — BP 100/74 | Temp 98.2°F | Wt 199.0 lb

## 2012-08-26 DIAGNOSIS — Z1151 Encounter for screening for human papillomavirus (HPV): Secondary | ICD-10-CM | POA: Insufficient documentation

## 2012-08-26 DIAGNOSIS — Z01419 Encounter for gynecological examination (general) (routine) without abnormal findings: Secondary | ICD-10-CM | POA: Insufficient documentation

## 2012-08-26 DIAGNOSIS — F419 Anxiety disorder, unspecified: Secondary | ICD-10-CM

## 2012-08-26 DIAGNOSIS — G43909 Migraine, unspecified, not intractable, without status migrainosus: Secondary | ICD-10-CM

## 2012-08-26 DIAGNOSIS — I776 Arteritis, unspecified: Secondary | ICD-10-CM

## 2012-08-26 DIAGNOSIS — Z0000 Encounter for general adult medical examination without abnormal findings: Secondary | ICD-10-CM

## 2012-08-26 DIAGNOSIS — F341 Dysthymic disorder: Secondary | ICD-10-CM

## 2012-08-26 DIAGNOSIS — R42 Dizziness and giddiness: Secondary | ICD-10-CM

## 2012-08-26 DIAGNOSIS — R9409 Abnormal results of other function studies of central nervous system: Secondary | ICD-10-CM

## 2012-08-26 LAB — BASIC METABOLIC PANEL
BUN: 21 mg/dL (ref 6–23)
CO2: 28 mEq/L (ref 19–32)
Chloride: 108 mEq/L (ref 96–112)
Creatinine, Ser: 0.9 mg/dL (ref 0.4–1.2)
Potassium: 4.1 mEq/L (ref 3.5–5.1)

## 2012-08-26 LAB — LIPID PANEL
HDL: 71.3 mg/dL (ref >39.00)
Total CHOL/HDL Ratio: 3
Triglycerides: 111 mg/dL (ref 0.0–149.0)

## 2012-08-26 NOTE — Patient Instructions (Signed)
-  We have ordered labs or studies at this visit. It can take up to 1-2 weeks for results and processing. We will contact you with instructions IF your results are abnormal. Normal results will be released to your [facility]. If you have not heard from [place] or can not find your results in [facility] in 2 weeks please contact our office.  -please make sure to get your colonoscopy in September  -follow up yearly for physical and as needed

## 2012-08-26 NOTE — Progress Notes (Signed)
Chief Complaint  Patient presents with  . Annual Exam    HPI:  Here for CPE:  -Concerns today: none, continued but somewhat mild HAs and depression, seeing her neurologist tomorrow, reports symptoms manageable, tapering down on prednisone Parents in car accident and serious health issues, has been taking care of them.  -Diet: variety of foods, balance and well rounded, larger portion sizes  -vit d/calcium: no  -Exercise: no regular exercise  -Diabetes and Dyslipidemia Screening: lipids, CMP, CBC ith Dr. Deitra Mayo normal  -Hx of HTN: no  -Vaccines: UTD  -pap history: from prior PCP records (Dr. Deitra Mayo) paps normal 2011 and 2012  -FDLMP: post menopausal, no bleeding  -sexual activity: yes, female partner, no new partners  -wants STI testing: no  -FH breast, colon or ovarian ca: see FH -mammo 07/2012 birads 1 -was scheduled for colonoscopy in September  -Alcohol, Tobacco, drug use: see social history  Review of Systems - unchanged. See above.  Past Medical History  Diagnosis Date  . Chicken pox   . Depression   . Headache(784.0)     frequent  . Mouth ulcer   . Genital ulcer, female   . Migraines   . Urine incontinence   . IBS (irritable bowel syndrome)   . Behcet's syndrome   . Vertigo     Family History  Problem Relation Age of Onset  . Arthritis    . Prostate cancer Father   . Heart disease Father     a. fib  . Parkinson's disease Father   . Alcoholism Brother   . Colon cancer Paternal Grandfather 67  . Stroke Paternal Grandmother     in 98s    History   Social History  . Marital Status: Married    Spouse Name: N/A    Number of Children: N/A  . Years of Education: N/A   Social History Main Topics  . Smoking status: Never Smoker   . Smokeless tobacco: None  . Alcohol Use: No  . Drug Use: None  . Sexually Active: Yes     Comment: with husabnd   Other Topics Concern  . 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 , Disp: , Rfl: ;  Cholecalciferol (D3 DOTS) 2000 UNITS TBDP, Take 2,000 Units by mouth 2 (two) times daily. , Disp: , Rfl: ;  COCONUT OIL PO, Take by mouth., Disp: , Rfl: ;  Coenzyme Q10 (CO Q 10) 10 MG CAPS, Take 100 mg by mouth every morning., Disp: , Rfl:  fish oil-omega-3 fatty acids 1000 MG capsule, Take 2 g by mouth 2 (two) times daily., Disp: , Rfl: ;  hyoscyamine (LEVBID) 0.375 MG 12 hr tablet, Take 0.375 mg by mouth every 12 (twelve) hours as needed. , Disp: , Rfl: ;  Multiple Vitamin (MULTIVITAMIN) tablet, Take 1 tablet by mouth daily., Disp: , Rfl: ;  naproxen sodium (ANAPROX) 550 MG tablet, Take 550 mg by mouth 2 (two) times daily with a meal., Disp: , Rfl:  pentoxifylline (TRENTAL) 400 MG CR tablet, Take 400 mg  by mouth 2 (two) times daily. , Disp: , Rfl: ;  predniSONE (DELTASONE) 20 MG tablet, Take 15 mg by mouth daily. , Disp: , Rfl: ;  Probiotic Product (PROBIOTIC DAILY PO), Take by mouth. Florify 5 billion cfu, Disp: , Rfl: ;  RESTASIS 0.05 % ophthalmic emulsion, , Disp: , Rfl: ;  Rhubarb EXTR, 4 mg by Does not apply route daily., Disp: , Rfl:  SUMAtriptan (IMITREX) 100 MG tablet, Take 100 mg by mouth every 2 (two) hours as needed for migraine., Disp: , Rfl:   EXAM:  Filed Vitals:   08/26/12 0931  BP: 100/74  Temp: 98.2 F (36.8 C)    GENERAL: vitals reviewed and listed below, alert, oriented, appears well hydrated and in no acute distress  HEENT: head atraumatic, PERRLA, normal appearance of eyes, ears, nose and mouth. moist mucus membranes.  NECK: supple, no masses or lymphadenopathy  LUNGS: clear to auscultation bilaterally,  no rales, rhonchi or wheeze  CV: HRRR, no peripheral edema or cyanosis, normal pedal pulses  BREAST: normal appearance - no lesions or discharge, on palpation normal breast tissue without any suspicious masses  ABDOMEN: bowel sounds normal, soft, non tender to palpation, no masses, no rebound or guarding  GU: normal appearance of external genitalia with mild erythema of vulva - no lesions or masses, normal vaginal mucosa - no abnormal discharge, normal appearance of cervix - no lesions or abnormal discharge, no masses or tenderness on palpation of uterus and ovaries.  RECTAL: deferred  SKIN: no rash or abnormal lesions  MS: normal gait, moves all extremities normally  NEURO: CN II-XII grossly intact, normal muscle strength and sensation to light touch on extremities  PSYCH: normal affect, pleasant and cooperative  ASSESSMENT AND PLAN:  Discussed the following assessment and plan:  Visit for preventive health examination - Plan: Lipid Panel, Hemoglobin A1c, Basic metabolic panel, Cytology - PAP  Anxiety and depression  Migraine   -Discussed and advised all [place] preventive services health task force level A and B recommendations for age, sex and risks.  -Advised at least 150 minutes of exercise per week and a healthy diet low in saturated fats and sweets and consisting of fresh fruits and vegetables, lean meats such as fish and white chicken and whole grains.  -FASTING LABS today, on prednisone -she has colonsocopy scheduled for September -mechanical fall, doing ok -chronic depression, improving -pap with hpv  -labs, studies and vaccines per orders this encounter  Orders Placed This Encounter  Procedures  . Lipid Panel  . Hemoglobin A1c  . Basic metabolic panel    There are no Patient Instructions on file for this visit.  Patient advised to return to clinic immediately if symptoms worsen or persist or new concerns.  @LIFEPLAN @  No Follow-up on file.  Kriste Basque  R.

## 2012-08-27 ENCOUNTER — Encounter: Payer: Self-pay | Admitting: Neurology

## 2012-08-27 ENCOUNTER — Ambulatory Visit (INDEPENDENT_AMBULATORY_CARE_PROVIDER_SITE_OTHER): Payer: 59 | Admitting: Neurology

## 2012-08-27 ENCOUNTER — Telehealth: Payer: Self-pay | Admitting: Neurology

## 2012-08-27 ENCOUNTER — Other Ambulatory Visit: Payer: Self-pay | Admitting: Neurology

## 2012-08-27 ENCOUNTER — Ambulatory Visit (INDEPENDENT_AMBULATORY_CARE_PROVIDER_SITE_OTHER): Payer: 59

## 2012-08-27 VITALS — BP 115/83 | HR 77 | Wt 200.0 lb

## 2012-08-27 DIAGNOSIS — R42 Dizziness and giddiness: Secondary | ICD-10-CM

## 2012-08-27 DIAGNOSIS — R413 Other amnesia: Secondary | ICD-10-CM

## 2012-08-27 DIAGNOSIS — G43909 Migraine, unspecified, not intractable, without status migrainosus: Secondary | ICD-10-CM

## 2012-08-27 DIAGNOSIS — Z0289 Encounter for other administrative examinations: Secondary | ICD-10-CM

## 2012-08-27 MED ORDER — DIAZEPAM 2 MG PO TABS: 2.0000 mg | ORAL_TABLET | Freq: Three times a day (TID) | ORAL | Status: DC

## 2012-08-27 NOTE — Procedures (Signed)
History:  Samantha Pope is a 52 year old patient with a history of Behcet's disease. The patient has a five-month history of dizziness that has not responded to medications or vestibular therapy. The patient is being evaluated for this issue.  Description: The brainstem auditory evoked response test was performed today using 85 dB rarefraction clicks in the ipsilateral ear and 40 dB masking noise in the contralateral ear. The absolute latencies for waveforms I, III, and V were within normal limits bilaterally. The interpeak latencies for waveforms I-III, III-V, and I-V were within normal limits bilaterally. The amplitudes of waveforms I and V were within normal limits bilaterally.  Impression:  The brainstem auditory evoked response test done today was within normal limits bilaterally. No evidence of conduction slowing within the peripheral or central nervous system on either side was seen on today's evaluation.

## 2012-08-27 NOTE — Progress Notes (Signed)
Reason for visit: Dizziness  Samantha Pope is an 52 y.o. female  History of present illness:  Samantha Pope is a 52 year old right-handed white female with a history of Behcet's disease. The patient was noted to have onset of vertigo suddenly in early March 2014. The patient underwent MRI evaluation the brain, and the radiographic interpretation was that there was abnormal enhancement of the meninges on the contrasted view. By my review, this study was normal. The patient underwent lumbar puncture with a normal opening pressure of 120 mm of water, and spinal fluid analysis was completely normal, without evidence of meningitis. The patient reports that the dizziness sensation is a rocking sensation, not a spinning sensation. The episodes are intermittent, usually associated with certain activities such as going to the grocery store or moving her head too quickly or walking too fast. The patient denies any nausea or vomiting. The patient developed daily headaches in early June 2014. The headaches are bitemporal in nature, associated with fatigue or driving a car. The patient does have a history of migraine, but she indicates that these headaches are different from her usual migraine. Her usual migraine is unilateral, on the right or left temporal region associated with photophobia and phonophobia. These headaches are mild, and do not impair her ability to function. The patient may take Naprosyn or Excedrin Migraine for the headache. The patient denies any significant neck stiffness. The patient will occasionally have tinnitus, but she denies any hearing changes or ear pain. The patient continues to have her lightheaded sensations, and she indicates that this was not benefited by vestibular rehabilitation. The patient feels somewhat imbalanced with walking, and she had one fall on 08/06/2012. The patient has been given a trial on Topamax without benefit. The patient also reports problems with memory and  concentration that she indicates came on around the same time as the dizziness. The patient has irritable bowel syndrome, and she indicates that recently she has begun taking her hyoscyamine on a daily basis. The patient also indicates that she is under increased stress with the illness of one of her parents. The patient is on a tapering dose of prednisone, and she remains on low-dose Imuran. The patient is actively pursuing disability, and she comes into the office today tape recording the conversation and interaction with her physician. The patient indicates that she is having menopausal symptoms, and she has had some urge incontinence of the bladder with this.  Past Medical History  Diagnosis Date  . Chicken pox   . Depression   . Headache(784.0)     frequent  . Mouth ulcer   . Genital ulcer, female   . Migraines   . Urine incontinence   . IBS (irritable bowel syndrome)   . Behcet's syndrome   . Vertigo   . Obesity     Past Surgical History  Procedure Laterality Date  . Oral implant  2014  . Lasik  2014  . Breast biopsy  1980    benign  . Hernia repair  1966    as a baby    Family History  Problem Relation Age of Onset  . Arthritis    . Prostate cancer Father   . Heart disease Father     a. fib  . Parkinson's disease Father   . Alcoholism Brother   . Colon cancer Paternal Grandfather 88  . Stroke Paternal Grandmother     in 11s  . Brain cancer Brother     Social history:  reports that she has never smoked. She does not have any smokeless tobacco history on file. She reports that she does not drink alcohol or use illicit drugs.  Allergies:  Allergies  Allergen Reactions  . Erythromycin Hives  . Penicillins Hives    Medications:  Current Outpatient Prescriptions on File Prior to Visit  Medication Sig Dispense Refill  . azaTHIOprine (IMURAN) 50 MG tablet Take 50 mg by mouth daily.       . Cholecalciferol (D3 DOTS) 2000 UNITS TBDP Take 2,000 Units by mouth 2  (two) times daily.       . COCONUT OIL PO Take by mouth.      . Coenzyme Q10 (CO Q 10) 10 MG CAPS Take 100 mg by mouth every morning.      . fish oil-omega-3 fatty acids 1000 MG capsule Take 2 g by mouth 2 (two) times daily.      . hyoscyamine (LEVBID) 0.375 MG 12 hr tablet Take 0.375 mg by mouth every 12 (twelve) hours as needed.       . Multiple Vitamin (MULTIVITAMIN) tablet Take 1 tablet by mouth daily.      . naproxen sodium (ANAPROX) 550 MG tablet Take 550 mg by mouth 2 (two) times daily with a meal.      . pentoxifylline (TRENTAL) 400 MG CR tablet Take 400 mg by mouth 2 (two) times daily.       . Probiotic Product (PROBIOTIC DAILY PO) Take by mouth. Florify 5 billion cfu      . RESTASIS 0.05 % ophthalmic emulsion       . Rhubarb EXTR 4 mg by Does not apply route daily.      . SUMAtriptan (IMITREX) 100 MG tablet Take 100 mg by mouth every 2 (two) hours as needed for migraine.       No current facility-administered medications on file prior to visit.    ROS:  Out of a complete 14 system review of symptoms, the patient complains only of the following symptoms, and all other reviewed systems are negative.  Fatigue Dizziness, tinnitus Feeling hot, flushing Confusion, headache, dizziness  Blood pressure 115/83, pulse 77, weight 200 lb (90.719 kg), last menstrual period 07/21/2011.  Physical Exam  General: The patient is alert and cooperative at the time of the examination. The patient is minimally obese.  Skin: No significant peripheral edema is noted.   Neurologic Exam  Cranial nerves: Facial symmetry is present. Speech is normal, no aphasia or dysarthria is noted. Extraocular movements are full. Visual fields are full. With rapid eye movements, no nystagmus is seen.  Motor: The patient has good strength in all 4 extremities.  Coordination: The patient has good finger-nose-finger and heel-to-shin bilaterally.  Gait and station: The patient has a normal gait. Tandem gait is  slightly unsteady. Romberg is negative. No drift is seen.  Reflexes: Deep tendon reflexes are symmetric.   Assessment/Plan:  One. History Behcet's disease  2. Subjective dizziness  3. Daily headaches, likely tension headache  4. History of migraine  The patient has undergone an evaluation today with MRI and lumbar puncture that were unremarkable. The patient is having subjective symptoms of headache and dizziness. Clinical examination is relatively unremarkable. The patient will be given a trial on diazepam taking 2 mg 3 times daily. The patient reports some problems with memory concentration, without severe fatigue or excessive daytime drowsiness. The patient does snore at night, but the husband has not noted any apneic episodes. We will check blood  work today looking for other etiologies of memory problems, and we will consider a sleep study in the future depending upon how the patient does with her cognitive issues in the future. The patient is on hyoscyamine has anticholinergic effects that may impair memory and concentration. The patient will undergo a brainstem auditory evoked response test. The patient followup in 4 or 5 months. The patient has a sedentary job, and I do not see any neurologic deficits at this time that would prevent her from working.  Marlan Palau MD 08/28/2012 10:51 AM  [facility] [address] [place], [place] [address]  Phone [phone] Fax [phone]

## 2012-08-27 NOTE — Telephone Encounter (Signed)
Called patient. The brainstem auditory evoked response test was normal.

## 2012-08-29 LAB — AMMONIA: Ammonia: 57 ug/dL (ref 19–87)

## 2012-08-29 LAB — HIV ANTIBODY (ROUTINE TESTING W REFLEX): HIV-1/HIV-2 Ab: NONREACTIVE

## 2012-08-30 ENCOUNTER — Telehealth: Payer: Self-pay | Admitting: *Deleted

## 2012-08-30 DIAGNOSIS — Z0289 Encounter for other administrative examinations: Secondary | ICD-10-CM

## 2012-08-30 NOTE — Telephone Encounter (Signed)
Results have been mailed to the patient. 

## 2012-08-31 NOTE — Progress Notes (Signed)
Quick Note:  Called and spoke with pt and pt is aware. ______ 

## 2012-09-07 NOTE — Telephone Encounter (Signed)
Spoke to Dr. Anne Hahn and he cannot do any disability forms until he sees patient on follow up visit.  Patient had an appointment on 08-27-12 and those notes will be forwarded to [employer] disability.

## 2012-10-08 DIAGNOSIS — Z0289 Encounter for other administrative examinations: Secondary | ICD-10-CM

## 2012-10-11 ENCOUNTER — Encounter: Payer: Self-pay | Admitting: Internal Medicine

## 2012-10-11 ENCOUNTER — Ambulatory Visit (AMBULATORY_SURGERY_CENTER): Payer: Managed Care, Other (non HMO)

## 2012-10-11 VITALS — Ht 67.0 in | Wt 192.0 lb

## 2012-10-11 DIAGNOSIS — Z1211 Encounter for screening for malignant neoplasm of colon: Secondary | ICD-10-CM

## 2012-10-11 MED ORDER — SUPREP BOWEL PREP KIT 17.5-3.13-1.6 GM/177ML PO SOLN: 1.0000 | Freq: Once | ORAL | Status: DC

## 2012-10-26 ENCOUNTER — Encounter: Payer: Self-pay | Admitting: Internal Medicine

## 2012-10-26 ENCOUNTER — Ambulatory Visit (AMBULATORY_SURGERY_CENTER): Payer: 59 | Admitting: Internal Medicine

## 2012-10-26 VITALS — BP 119/65 | HR 68 | Temp 97.8°F | Resp 10 | Ht 67.0 in | Wt 192.0 lb

## 2012-10-26 DIAGNOSIS — Z1211 Encounter for screening for malignant neoplasm of colon: Secondary | ICD-10-CM

## 2012-10-26 DIAGNOSIS — D126 Benign neoplasm of colon, unspecified: Secondary | ICD-10-CM

## 2012-10-26 MED ORDER — SODIUM CHLORIDE 0.9 % IV SOLN: 500.0000 mL | INTRAVENOUS | Status: DC

## 2012-10-26 NOTE — Patient Instructions (Addendum)
I found and removed one tiny polyp that looks benign. Otherwise normal colon.   I will let you know pathology results and when to have another routine colonoscopy by mail. I appreciate the opportunity to care for you.  Iva Boop, MD, FACG  YOU HAD AN ENDOSCOPIC PROCEDURE TODAY AT THE [facility]: Refer to the procedure report that was given to you for any specific questions about what was found during the examination.  If the procedure report does not answer your questions, please call your gastroenterologist to clarify.  If you requested that your care partner not be given the details of your procedure findings, then the procedure report has been included in a sealed envelope for you to review at your convenience later.  YOU SHOULD EXPECT: Some feelings of bloating in the abdomen. Passage of more gas than usual.  Walking can help get rid of the air that was put into your GI tract during the procedure and reduce the bloating. If you had a lower endoscopy (such as a colonoscopy or flexible sigmoidoscopy) you may notice spotting of blood in your stool or on the toilet paper. If you underwent a bowel prep for your procedure, then you may not have a normal bowel movement for a few days.  DIET: Your first meal following the procedure should be a light meal and then it is ok to progress to your normal diet.  A half-sandwich or bowl of soup is an example of a good first meal.  Heavy or fried foods are harder to digest and may make you feel nauseous or bloated.  Likewise meals heavy in dairy and vegetables can cause extra gas to form and this can also increase the bloating.  Drink plenty of fluids but you should avoid alcoholic beverages for 24 hours.  ACTIVITY: Your care partner should take you home directly after the procedure.  You should plan to take it easy, moving slowly for the rest of the day.  You can resume normal activity the day after the procedure however you should NOT DRIVE or  use heavy machinery for 24 hours (because of the sedation medicines used during the test).    SYMPTOMS TO REPORT IMMEDIATELY: A gastroenterologist can be reached at any hour.  During normal business hours, 8:30 AM to 5:00 PM Monday through Friday, call [phone].  After hours and on weekends, please call the GI answering service at [phone] who will take a message and have the physician on call contact you.   Following lower endoscopy (colonoscopy or flexible sigmoidoscopy):  Excessive amounts of blood in the stool  Significant tenderness or worsening of abdominal pains  Swelling of the abdomen that is new, acute  Fever of 100F or higher   FOLLOW UP: If any biopsies were taken you will be contacted by phone or by letter within the next 1-3 weeks.  Call your gastroenterologist if you have not heard about the biopsies in 3 weeks.  Our staff will call the home number listed on your records the next business day following your procedure to check on you and address any questions or concerns that you may have at that time regarding the information given to you following your procedure. This is a courtesy call and so if there is no answer at the home number and we have not heard from you through the emergency physician on call, we will assume that you have returned to your regular daily activities without incident.  SIGNATURES/CONFIDENTIALITY: You  and/or your care partner have signed paperwork which will be entered into your electronic medical record.  These signatures attest to the fact that that the information above on your After Visit Summary has been reviewed and is understood.  Full responsibility of the confidentiality of this discharge information lies with you and/or your care-partner.  Recommendations Timing of repeat colonoscopy will be determined by pathology findings

## 2012-10-26 NOTE — Progress Notes (Signed)
Called to room to assist during endoscopic procedure.  Patient ID and intended procedure confirmed with present staff. Received instructions for my participation in the procedure from the performing physician.  

## 2012-10-26 NOTE — Op Note (Signed)
[facility] [address] [place], [address]   COLONOSCOPY PROCEDURE REPORT  PATIENT: Samantha Pope, Samantha Pope  MR#: [id number] BIRTHDATE: 08/28/1960 , 52  yrs. old GENDER: Female ENDOSCOPIST: Iva Boop, MD, [facility] REFERRED NF:AOZHYQ Selena Batten, D.O. PROCEDURE DATE:  10/26/2012 PROCEDURE:   Colonoscopy with biopsy First Screening Colonoscopy - Avg.  risk and is 50 yrs.  old or older Yes.  Prior Negative Screening - Now for repeat screening. N/A  History of Adenoma - Now for follow-up colonoscopy & has been > or = to 3 yrs.  N/A  Polyps Removed Today? Yes. ASA CLASS:   Class II INDICATIONS:average risk screening and first colonoscopy. MEDICATIONS: propofol (Diprivan) 250mg  IV, MAC sedation, administered by CRNA, and These medications were titrated to patient response per physician's verbal order  DESCRIPTION OF PROCEDURE:   After the risks benefits and alternatives of the procedure were thoroughly explained, informed consent was obtained.  A digital rectal exam revealed no abnormalities of the rectum.   The LB [id number]-H190 [id number]  endoscope was introduced through the anus and advanced to the cecum, which was identified by both the appendix and ileocecal valve. No adverse events experienced.   The quality of the prep was Suprep good  The instrument was then slowly withdrawn as the colon was fully examined.    COLON FINDINGS: A sessile polyp measuring 2-3 mm in size was found in the transverse colon.  A polypectomy was performed with cold forceps.  The resection was complete and the polyp tissue was completely retrieved.   The colon mucosa was otherwise normal.   A right colon retroflexion was performed.  Retroflexed views revealed no abnormalities. The time to cecum=3 minutes 00 seconds. Withdrawal time=9 minutes 54 seconds.  The scope was withdrawn and the procedure completed. COMPLICATIONS: There were no complications.  ENDOSCOPIC IMPRESSION: 1.   Sessile polyp  measuring 2-3 mm in size was found in the transverse colon; polypectomy was performed with cold forceps 2.   The colon mucosa was otherwise normal - good prep - first colonoscopy  RECOMMENDATIONS: Timing of repeat colonoscopy will be determined by pathology findings.   eSigned:  Iva Boop, MD, [facility] 10/26/2012 9:49 AM   cc: The Patient  and Kriste Basque, DO

## 2012-10-26 NOTE — Progress Notes (Signed)
Patient did not have preoperative order for IV antibiotic SSI prophylaxis. (G8918)  Patient did not experience any of the following events: a burn prior to discharge; a fall within the facility; wrong site/side/patient/procedure/implant event; or a hospital transfer or hospital admission upon discharge from the facility. (G8907)  

## 2012-10-26 NOTE — Progress Notes (Signed)
No allergy to eggs or soy

## 2012-10-26 NOTE — Progress Notes (Signed)
Lidocaine-40mg IV prior to Propofol InductionPropofol given over incremental dosages 

## 2012-10-27 ENCOUNTER — Telehealth: Payer: Self-pay | Admitting: *Deleted

## 2012-10-27 NOTE — Telephone Encounter (Signed)
No identifier, left message, follow-up  

## 2012-11-02 ENCOUNTER — Encounter: Payer: Self-pay | Admitting: Internal Medicine

## 2012-11-02 NOTE — Progress Notes (Signed)
Quick Note:  Polypoid colon mucosa - not a polyp - repeat colonoscopy 10 yrs - 2024 ______

## 2012-11-03 ENCOUNTER — Telehealth: Payer: Self-pay

## 2012-11-03 NOTE — Telephone Encounter (Signed)
I called and left patient a message to call me about her disability accommodation form.   Should patient call back, please let her know that Dr. Anne Hahn has indicated on patient's last OV of August 27, 2012, that he sees no reason for her not to be able to work. With that said, does she want [place] to move forward with completing this form?

## 2012-11-04 ENCOUNTER — Telehealth: Payer: Self-pay

## 2012-11-04 NOTE — Telephone Encounter (Signed)
Patient returned my call. She said she has retired and that will be fine to go ahead and send in disability form. I will complete and submit.

## 2012-11-29 ENCOUNTER — Ambulatory Visit (INDEPENDENT_AMBULATORY_CARE_PROVIDER_SITE_OTHER): Payer: 59

## 2012-11-29 DIAGNOSIS — Z23 Encounter for immunization: Secondary | ICD-10-CM

## 2012-12-01 ENCOUNTER — Encounter: Payer: Self-pay | Admitting: Family Medicine

## 2012-12-01 MED ORDER — HYOSCYAMINE SULFATE ER 0.375 MG PO TB12: 0.3750 mg | ORAL_TABLET | Freq: Two times a day (BID) | ORAL | Status: DC | PRN

## 2012-12-01 NOTE — Telephone Encounter (Signed)
Rx sent to pharmacy   

## 2012-12-28 ENCOUNTER — Ambulatory Visit: Payer: 59 | Admitting: Nurse Practitioner

## 2013-02-17 ENCOUNTER — Ambulatory Visit (INDEPENDENT_AMBULATORY_CARE_PROVIDER_SITE_OTHER): Payer: 59 | Admitting: Family Medicine

## 2013-02-17 VITALS — BP 100/78 | Temp 99.2°F | Wt 187.0 lb

## 2013-02-17 DIAGNOSIS — M79609 Pain in unspecified limb: Secondary | ICD-10-CM

## 2013-02-17 DIAGNOSIS — M79671 Pain in right foot: Secondary | ICD-10-CM

## 2013-02-17 DIAGNOSIS — R42 Dizziness and giddiness: Secondary | ICD-10-CM

## 2013-02-17 DIAGNOSIS — M79672 Pain in left foot: Principal | ICD-10-CM

## 2013-02-17 DIAGNOSIS — K589 Irritable bowel syndrome without diarrhea: Secondary | ICD-10-CM

## 2013-02-17 MED ORDER — HYOSCYAMINE SULFATE ER 0.375 MG PO TB12: 0.3750 mg | ORAL_TABLET | Freq: Two times a day (BID) | ORAL | Status: DC | PRN

## 2013-02-17 NOTE — Progress Notes (Signed)
Pre visit review using our clinic review tool, if applicable. No additional management support is needed unless otherwise documented below in the visit note. 

## 2013-02-17 NOTE — Patient Instructions (Signed)
-  try a strassburg sock   -gait analysis can try: Off n running: [address] [place], [place] [address] [phone]  -call us if you want a referral to see a podiatrist or a [facility] neurolog[facility]

## 2013-02-17 NOTE — Progress Notes (Signed)
Chief Complaint  Patient presents with  . bilateral foot pain  . medicaiton refill    HPI:  Acute visit for foot pain: -started last summer when started walking -both feet start hurting when up on feet for a long time -has been really working on diet and has been cycling and has lost weight and cycling doe snot bother her feet -denies: weakness, numbness, swelling, redness  Vestibular neuritis: -improved, but still has vertigo, headaches at times -seeing neurologist here, but considering seeing another neurologist  IBS: -needs refill   ROS: See pertinent positives and negatives per HPI.  Past Medical History  Diagnosis Date  . Chicken pox   . Depression   . Headache(784.0)     frequent  . Mouth ulcer   . Genital ulcer, female   . Migraines   . Urine incontinence   . IBS (irritable bowel syndrome)   . Behcet's syndrome   . Vertigo   . Obesity     Past Surgical History  Procedure Laterality Date  . Oral implant  2014  . Lasik  2014  . Breast biopsy  1980    benign  . Hernia repair  1966    as a baby    Family History  Problem Relation Age of Onset  . Arthritis    . Prostate cancer Father   . Heart disease Father     a. fib  . Parkinson's disease Father   . Alcoholism Brother   . Colon cancer Paternal Grandfather 29  . Stroke Paternal Grandmother     in 60s  . Brain cancer Brother     History   Social History  . Marital Status: Married    Spouse Name: N/A    Number of Children: 4  . Years of Education: 14   Occupational History  .  At [employer]   Social History Main Topics  . Smoking status: Never Smoker   . Smokeless tobacco: Never Used  . Alcohol Use: No  . Drug Use: No  . Sexual Activity: Yes     Comment: with husabnd   Other Topics Concern  . 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 , Disp: , Rfl: ;  Cholecalciferol (D3 DOTS) 2000 UNITS TBDP, Take 2,000 Units by mouth 2 (two) times daily. , Disp: , Rfl: ;  Coenzyme Q10 (CO Q 10) 10 MG CAPS, Take 100 mg by mouth every morning., Disp: , Rfl: ;  fish oil-omega-3 fatty acids 1000 MG capsule, Take 2 g by mouth 2 (two) times daily., Disp: , Rfl:  hyoscyamine (LEVBID) 0.375 MG 12 hr tablet, Take 1 tablet (0.375 mg total) by mouth every 12 (twelve) hours as needed., Disp: 180 tablet, Rfl: 1;  magnesium aspartate (MAGINEX) 615 MG tablet, Take 615 mg by mouth 2 (two) times daily. 3 at night, Disp: , Rfl: ;  Multiple Vitamin (MULTIVITAMIN) tablet, Take 1 tablet by mouth daily., Disp: , Rfl:  naproxen sodium (ANAPROX) 550 MG tablet, Take 550 mg by mouth 2 (two)  times daily with a meal., Disp: , Rfl: ;  Nutritional Supplements (ESTROVEN EXTRA STRENGTH) TABS, Take by mouth every morning., Disp: , Rfl: ;  OVER THE COUNTER MEDICATION, Vegetarian capsules, Disp: , Rfl: ;  OVER THE COUNTER MEDICATION, 3 happy calm focused in the am, Disp: , Rfl: ;  OVER THE COUNTER MEDICATION, thyrosine (THYROID SUPPORT FORMULA), Disp: , Rfl:  OVER THE COUNTER MEDICATION, Take 1 capsule by mouth 2 (two) times daily. Tumeric, Disp: , Rfl: ;  pentoxifylline (TRENTAL) 400 MG CR tablet, Take 400 mg by mouth 2 (two) times daily. , Disp: , Rfl: ;  predniSONE (DELTASONE) 5 MG tablet, Take 10 mg by mouth daily. , Disp: , Rfl: ;  Probiotic Product (PROBIOTIC DAILY PO), Take by mouth. Florify 5 billion cfu, Disp: , Rfl: ;  RESTASIS 0.05 % ophthalmic emulsion, , Disp: , Rfl:  Rhubarb EXTR, 4 mg by Does not apply route daily., Disp: , Rfl: ;  SUMAtriptan (IMITREX) 100 MG tablet, Take 100 mg by mouth every 2 (two) hours as needed for migraine., Disp:  , Rfl:   EXAM:  Filed Vitals:   02/17/13 1304  BP: 100/78  Temp: 99.2 F (37.3 C)    Body mass index is 29.28 kg/(m^2).  GENERAL: vitals reviewed and listed above, alert, oriented, appears well hydrated and in no acute distress  HEENT: atraumatic, conjunttiva clear, no obvious abnormalities on inspection of external nose and ears  NECK: no obvious masses on inspection  LUNGS: clear to auscultation bilaterally, no wheezes, rales or rhonchi, good air movement  CV: HRRR, no peripheral edema  MS: moves all extremities without noticeable abnormality -normal gait -normal appearance of feet, LEs and ankles except mild pes planus -no bony TTP, swelling or erythema -neg ant/post drawer, neg tinel's -TTP over plantar fascia  PSYCH: pleasant and cooperative, no obvious depression or anxiety  ASSESSMENT AND PLAN:  Discussed the following assessment and plan:  Foot pain, bilateral  IBS (irritable bowel syndrome)  Vertigo  -mild mechanical foot issues likely related to arch/weight and possible plantar fascia - advised per recs -she will call if this is not working and will see podiatry -[facility] - she opted to think abut this and let us know, is improving -refilled IBS med -Patient advised to return or notify a doctor immediately if symptoms worsen or persist or new concerns arise.  Patient Instructions  -try a strassburg sock   -gait analysis can try: Off n running: [address] [place], [place] [address] [phone]  -call us if you want a referral to see a podiatrist or a [facility] neurologist        KIM, Jarrett Soho R.

## 2013-09-07 ENCOUNTER — Other Ambulatory Visit: Payer: Self-pay

## 2013-09-07 DIAGNOSIS — Z1231 Encounter for screening mammogram for malignant neoplasm of breast: Secondary | ICD-10-CM

## 2013-09-22 ENCOUNTER — Ambulatory Visit
Admission: RE | Admit: 2013-09-22 | Discharge: 2013-09-22 | Disposition: A | Discharge status: Home / Self Care | Payer: Self-pay | Source: Ambulatory Visit

## 2013-09-22 DIAGNOSIS — Z1231 Encounter for screening mammogram for malignant neoplasm of breast: Secondary | ICD-10-CM

## 2013-11-01 ENCOUNTER — Telehealth: Payer: Self-pay | Admitting: *Deleted

## 2013-11-01 ENCOUNTER — Encounter: Payer: Managed Care, Other (non HMO) | Admitting: Family Medicine

## 2013-11-01 ENCOUNTER — Ambulatory Visit (INDEPENDENT_AMBULATORY_CARE_PROVIDER_SITE_OTHER): Payer: Managed Care, Other (non HMO) | Admitting: *Deleted

## 2013-11-01 DIAGNOSIS — Z23 Encounter for immunization: Secondary | ICD-10-CM

## 2013-11-01 NOTE — Progress Notes (Signed)
Error   This encounter was created in error - please disregard. 

## 2013-11-01 NOTE — Telephone Encounter (Signed)
I called the pt and advised her per Dr Maudie Mercury she will not be able to complete disability forms for her as she should see the physician whether it be the neurologist or eye doctor that is treating her condition which she feels has caused this.  She stated she did not know what to do as the neurologist does not have an appt until 1 month from now and I advised her to call their office to check for cancellations.  She asked if she could get a flu shot and I informed her she can see me for this and she can come in at 9 or 10am or at her appt time and she agreed.

## 2014-01-25 DIAGNOSIS — Z79899 Other long term (current) drug therapy: Secondary | ICD-10-CM | POA: Insufficient documentation

## 2014-03-08 ENCOUNTER — Encounter: Payer: Self-pay | Admitting: Family Medicine

## 2014-03-09 MED ORDER — HYOSCYAMINE SULFATE ER 0.375 MG PO TB12: 0.3750 mg | ORAL_TABLET | Freq: Two times a day (BID) | ORAL | Status: AC | PRN

## 2014-03-09 NOTE — Telephone Encounter (Signed)
Sent!

## 2014-07-14 NOTE — Telephone Encounter (Signed)
Error

## 2014-08-24 ENCOUNTER — Other Ambulatory Visit: Payer: Self-pay

## 2014-08-24 DIAGNOSIS — Z1231 Encounter for screening mammogram for malignant neoplasm of breast: Secondary | ICD-10-CM

## 2014-09-08 ENCOUNTER — Encounter: Payer: Self-pay | Admitting: Podiatry

## 2014-09-08 ENCOUNTER — Ambulatory Visit (INDEPENDENT_AMBULATORY_CARE_PROVIDER_SITE_OTHER): Payer: Managed Care, Other (non HMO) | Admitting: Podiatry

## 2014-09-08 ENCOUNTER — Ambulatory Visit (INDEPENDENT_AMBULATORY_CARE_PROVIDER_SITE_OTHER): Payer: Managed Care, Other (non HMO)

## 2014-09-08 VITALS — BP 118/78 | HR 63 | Resp 16 | Ht 67.0 in | Wt 175.0 lb

## 2014-09-08 DIAGNOSIS — M722 Plantar fascial fibromatosis: Secondary | ICD-10-CM

## 2014-09-08 MED ORDER — METHYLPREDNISOLONE 4 MG PO TBPK: ORAL_TABLET | ORAL | Status: DC

## 2014-09-08 NOTE — Patient Instructions (Signed)

## 2014-09-08 NOTE — Progress Notes (Signed)
   Subjective:    Patient ID: Samantha Pope, female    DOB: [birth year], 54 y.o.   MRN: [id number]  HPI Comments: "I have a lot of difficulty with my feet"  Patient c/o arch and heel pain bilateral since summer 2014. She has AM pain. Can't stand for long periods without a lot of pain. Tried OTC insoles, different shoes, Aleve, Ibuprofen, Tylenol-no help. She saw PCP and Rx'd meloxicam and recommended a baby aspirin as well-no help.  Foot Pain Associated symptoms include headaches.      Review of Systems  Genitourinary: Positive for urgency.  Neurological: Positive for dizziness and headaches.  All other systems reviewed and are negative.      Objective:   Physical Exam Physical Exam: I have reviewed her past history medications allergies surgery social history and review systems. Pulses are strongly palpable bilateral. Neurologic sensorium is intact for Semmes-Weinstein monofilament. Deep tendon reflexes are intact bilaterally muscle strength +5 over 5 dorsiflexors plantar flexors and inverters everters all Jones musculature is intact. Orthopedic evaluation demonstrates all joints distal to the ankle for range of motion without crepitation. Cutaneous evaluation demonstrates supple well-hydrated cutis. She has pain on palpation medial calcaneal tubercle of the bilateral heels. Radiographs confirm soft tissue increase in density at the plantar fascial calcaneal insertion site.       Assessment & Plan:  Assessment: Plantar fasciitis bilateral  Plan: Discussed etiology pathology conservative versus surgical therapies. Injected the heel today with Kenalog and local anesthetic. Dispensed a plantar fascial brace and a night splint. Discussed the appropriate shoe gear stretching exercises ice therapy shoe modifications. Dispensed a prescription for Medrol Dosepak to be followed with meloxicam, which she already has a prescription for. I will follow-up with her 1 month. Consider orthotics next  visits.

## 2014-09-14 ENCOUNTER — Ambulatory Visit: Payer: Managed Care, Other (non HMO) | Admitting: Podiatry

## 2014-09-25 ENCOUNTER — Ambulatory Visit
Admission: RE | Admit: 2014-09-25 | Discharge: 2014-09-25 | Disposition: A | Discharge status: Home / Self Care | Payer: Managed Care, Other (non HMO) | Source: Ambulatory Visit

## 2014-09-25 DIAGNOSIS — Z1231 Encounter for screening mammogram for malignant neoplasm of breast: Secondary | ICD-10-CM

## 2014-10-12 ENCOUNTER — Ambulatory Visit (INDEPENDENT_AMBULATORY_CARE_PROVIDER_SITE_OTHER): Payer: Managed Care, Other (non HMO) | Admitting: Podiatry

## 2014-10-12 ENCOUNTER — Encounter: Payer: Self-pay | Admitting: Podiatry

## 2014-10-12 VITALS — BP 98/63 | HR 76 | Resp 16

## 2014-10-12 DIAGNOSIS — M722 Plantar fascial fibromatosis: Secondary | ICD-10-CM | POA: Diagnosis not present

## 2014-10-12 NOTE — Progress Notes (Signed)
She presents today states that her feet are approximately 50-70% better as she refers to plantar fasciitis bilateral. She states that she did not take all her medication.  Objective: Vital signs are stable she is alert and oriented 3. Pulses are strongly palpable. She has pain on palpation medial calcaneal tubercles bilateral.  Assessment: Chronic intractable plantar fasciitis bilateral.  Plan: Discussed etiology pathology conservative versus surgical therapies. Encouraged her to continue with all conservative therapies including the braces and night splints. Appropriate shoe gear and icing. Also encouraged her to take her antibiotic inflammatory meloxicam 15 mg 1 by mouth daily and I will follow-up with her in 1 month. Consider orthotics at that time.

## 2014-11-09 ENCOUNTER — Encounter: Payer: Self-pay | Admitting: Podiatry

## 2014-11-09 ENCOUNTER — Ambulatory Visit (INDEPENDENT_AMBULATORY_CARE_PROVIDER_SITE_OTHER): Payer: Managed Care, Other (non HMO) | Admitting: Podiatry

## 2014-11-09 VITALS — BP 117/72 | HR 84 | Resp 12

## 2014-11-09 DIAGNOSIS — M722 Plantar fascial fibromatosis: Secondary | ICD-10-CM

## 2014-11-09 NOTE — Progress Notes (Signed)
She presents today and states that she is approximately 90-95% improved. She is referred to the plantar fasciitis of the bilateral heels. She states that she continues to take her antibiotic inflammatory's in usual plantar fascial brace daily.  Objective: Vital signs are stable she is alert and oriented 3 pulses are strongly palpable. No open lesions or wounds. She has pain on palpation medial calcaneal tubercles bilateral.  Assessment: Residual plantar fasciitis involving approximately 10-5% bilaterally. 95% resolved.  Plan: I encouraged her to continue all of her conservative therapies until 100% well +1 month. If one month from now she is not improved then we will consider orthotics.  Roselind Messier DPM

## 2014-12-07 ENCOUNTER — Ambulatory Visit (INDEPENDENT_AMBULATORY_CARE_PROVIDER_SITE_OTHER): Payer: Managed Care, Other (non HMO) | Admitting: Podiatry

## 2014-12-07 ENCOUNTER — Encounter: Payer: Self-pay | Admitting: Podiatry

## 2014-12-07 VITALS — BP 109/72 | HR 78 | Resp 12

## 2014-12-07 DIAGNOSIS — M722 Plantar fascial fibromatosis: Secondary | ICD-10-CM

## 2014-12-07 NOTE — Progress Notes (Signed)
She presents today follow-up of bilateral plantar fasciitis. She states that 3 weeks ago she went hiking in her feet started to bother her once again. She states that she continues to wear the plantar fascial braces utilize a night splint and take anti-inflammatories.  Objective: Vital signs are stable she is alert and oriented 3. Pulses are strongly palpable. She retains pain on palpation medial calcaneal tubercle bilateral.  Assessment: Pain in limb secondary to plantar fasciitis recurrent in nature more than likely biomechanical in nature.  Plan: At this point I recommended reinject in bilateral heels with Kenalog and local anesthetic we also scanned her for set of orthotics to be fabricated. Follow up with her once those come in.  Roselind Messier DPM

## 2015-01-05 ENCOUNTER — Ambulatory Visit: Payer: Managed Care, Other (non HMO) | Admitting: *Deleted

## 2015-01-05 DIAGNOSIS — M722 Plantar fascial fibromatosis: Secondary | ICD-10-CM

## 2015-01-05 NOTE — Patient Instructions (Signed)

## 2015-01-05 NOTE — Progress Notes (Signed)
Patient ID: Samantha Pope, female   DOB: 1960-12-15, 54 y.o.   MRN: [id number] Patient presents for orthotic pick up.  Verbal and written break in and wear instructions given.  Patient will follow up in 4 weeks if symptoms worsen or fail to improve.

## 2015-09-07 ENCOUNTER — Other Ambulatory Visit: Payer: Self-pay | Admitting: Internal Medicine

## 2015-09-07 DIAGNOSIS — Z1231 Encounter for screening mammogram for malignant neoplasm of breast: Secondary | ICD-10-CM

## 2015-09-21 ENCOUNTER — Other Ambulatory Visit (HOSPITAL_COMMUNITY)
Admission: RE | Admit: 2015-09-21 | Discharge: 2015-09-21 | Disposition: A | Discharge status: Home / Self Care | Payer: Managed Care, Other (non HMO) | Source: Ambulatory Visit | Attending: Internal Medicine | Admitting: Internal Medicine

## 2015-09-21 ENCOUNTER — Other Ambulatory Visit: Payer: Self-pay | Admitting: Internal Medicine

## 2015-09-21 DIAGNOSIS — Z01419 Encounter for gynecological examination (general) (routine) without abnormal findings: Secondary | ICD-10-CM | POA: Insufficient documentation

## 2015-09-21 DIAGNOSIS — Z1151 Encounter for screening for human papillomavirus (HPV): Secondary | ICD-10-CM | POA: Insufficient documentation

## 2015-09-25 LAB — CYTOLOGY - PAP

## 2015-09-27 ENCOUNTER — Ambulatory Visit
Admission: RE | Admit: 2015-09-27 | Discharge: 2015-09-27 | Disposition: A | Discharge status: Home / Self Care | Payer: Managed Care, Other (non HMO) | Source: Ambulatory Visit | Attending: Internal Medicine | Admitting: Internal Medicine

## 2015-09-27 DIAGNOSIS — Z1231 Encounter for screening mammogram for malignant neoplasm of breast: Secondary | ICD-10-CM

## 2016-07-02 ENCOUNTER — Encounter: Payer: Self-pay | Admitting: Allergy & Immunology

## 2016-07-02 ENCOUNTER — Ambulatory Visit (INDEPENDENT_AMBULATORY_CARE_PROVIDER_SITE_OTHER): Payer: Commercial Managed Care - PPO | Admitting: Allergy & Immunology

## 2016-07-02 VITALS — BP 110/90 | HR 67 | Temp 98.1°F | Resp 16 | Ht 67.32 in | Wt 189.0 lb

## 2016-07-02 DIAGNOSIS — T63481D Toxic effect of venom of other arthropod, accidental (unintentional), subsequent encounter: Secondary | ICD-10-CM | POA: Diagnosis not present

## 2016-07-02 DIAGNOSIS — D899 Disorder involving the immune mechanism, unspecified: Secondary | ICD-10-CM

## 2016-07-02 DIAGNOSIS — D849 Immunodeficiency, unspecified: Secondary | ICD-10-CM

## 2016-07-02 DIAGNOSIS — T781XXD Other adverse food reactions, not elsewhere classified, subsequent encounter: Secondary | ICD-10-CM | POA: Diagnosis not present

## 2016-07-02 DIAGNOSIS — J310 Chronic rhinitis: Secondary | ICD-10-CM

## 2016-07-02 DIAGNOSIS — W57XXXD Bitten or stung by nonvenomous insect and other nonvenomous arthropods, subsequent encounter: Secondary | ICD-10-CM

## 2016-07-02 DIAGNOSIS — T63481A Toxic effect of venom of other arthropod, accidental (unintentional), initial encounter: Secondary | ICD-10-CM | POA: Insufficient documentation

## 2016-07-02 MED ORDER — TRIAMCINOLONE ACETONIDE 0.1 % EX OINT
1.0000 | TOPICAL_OINTMENT | Freq: Two times a day (BID) | CUTANEOUS | 2 refills | Status: DC
Start: 2016-07-02 — End: 2023-02-10

## 2016-07-02 NOTE — Patient Instructions (Addendum)
1. Seasonal allergic rhinitis - Testing today showed: negative to our entire panel  - We did not do intradermal testing since your symptoms were not very severe.  - Continue with over the counter antihistamines as needed.   2. Large local reactions to insect stings (mosquitos, wasp, hornets, bees, and fire ants) - The reaction to the mosquitos is merely a nuisance and unlikely to cause any severe reactions. - This is known as Warehouse manager syndrome (informational handout provided). - We will send in a prescription for triamcinolone ointment to use as needed for severe rashes/swellings, which might help decrease the duration of symptoms.  - Change from benadryl to Zyrtec (cetirizine) as needed for the itching.  - We will schedule you for a Venom Day for testing.  - These visits are done quarterly because the testing supplies have a limited life span once they are thawed out.   3. Return in about 3 months (around 10/02/2016) for VENOM TESTING.  Please inform us of any Emergency Department visits, hospitalizations, or changes in symptoms. Call us before going to the ED for breathing or allergy symptoms since we might be able to fit you in for a sick visit. Feel free to contact us anytime with any questions, problems, or concerns.  It was a pleasure to meet you today! Happy spring!   Websites that have reliable patient information: 1. American Academy of Asthma, Allergy, and Immunology: www.aaaai.org 2. Food Allergy Research and Education (FARE): foodallergy.org 3. Mothers of Asthmatics: http://www.asthmacommunitynetwork.org 4. American College of Allergy, Asthma, and Immunology: www.acaai.org   Skeeter Syndrome Treatment   Mosquito avoidance (see information below)  Ice affected area  Oral antihistamine (Benadryl or Zyrtec)  Oral anti-inflammatory (ibuprofen)  Topical corticosteroid (Hydrocortisone cream 1%)    Strategies for Safer Mosquito Avoidance  by [place]  are a terrible nuisance in the muggy summer months, especially now that the ferocious Asian tiger mosquito has made a permanent home here in [place]. The arrival of Dexter virus has added some urgency to mosquito control measures, but spray programs and many repellents may do more harm than good in the long term. Choosing the least-toxic solutions can protect both your health and comfort in mosquito season. Here are some suggestions for safer and more effective bite avoidance this summer.   Population Control  Keeping mosquito populations in check is the most important way to avoid bites. It's no secret that removing sources of standing water is crucial to eliminating mosquito breeding grounds. Common breeding sites to watch for include:  * Rain gutters. Clean them out and offer to do the same for elderly neighbors or others who may not be able to do the job themselves. Remember that mosquito control is a community-wide effort.  * Flowerpots, buckets and old tires. Be sure empty containers cannot hold water.  * Bird baths and pet dishes. Empty and clean them weekly.  * Recycling bins and the cans inside. These may harbor stagnant water if not emptied regularly.  * Rain barrels. Be sure they are sealed off from mosquitoes.  * Storm drains. Watch for clogs from branches and garbage.  Insecticide sprays targeting adult mosquitoes can only reduce mosquito populations for a day or two. In fact, since insecticides also kill off important mosquito predators such as dragonflies, a spray program can actually be counter-productive by leaving the rebounding mosquito population without natural enemies.  Instead, interrupt the breeding cycle by using the nontoxic bacterial larvicide Bacillus thuringiensis var.  israelensis (Bti). Bti is sold in convenient donuts called "mosquito dunks" that you can safely use in your bird bath, rain barrel or low areas around your yard to kill mosquito larvae before the  adults emerge and spread throughout the community, where they become much harder to kill. Bti is not harmful to fish, birds or mammals, and single applications can remain effective for a month or more, even if the water source dries out and refills.   Safer Repellents  If you'll be outdoors at dawn or dusk when mosquitoes are most active, wear long clothes that don't leave skin exposed. (You may use insect repellent on your clothes). When you do get bites, soothe them by slathering on an astringent such as witch hazel after you come inside - it will prevent scratching and allow bites to heal quickly.  Lately many public health officials concerned about Becker virus have been advising people to use repellents containing the pesticide DEET (N,N-diethyl-meta-toluamide). While DEET is an extremely effective mosquito repellent, it is also a neurotoxin, and studies have shown that prolonged frequent exposure can irritate skin, cause muscle twitching and weakness and harm the brain and nervous system, especially when combined with other pesticides such as permethrin.  Consumer studies report that [employer]'s Skin-So-Soft and herbal repellents containing citronella can be just as effective as DEET at repelling mosquitoes but need to be applied more often. The solution is to choose the safer formulas and reapply as needed.  General guidelines for using any insect repellent:  * Choose oils or lotions rather than sprays, which produce fine particles that are easily inhaled.  * Do not apply repellents to broken skin.  * Do not allow children to apply their own repellent, and do not apply repellents containing DEET or other pesticides directly to children's skin. If you use such products, they can be applied to children's clothing instead.  * Do not use sunscreen/repellent combinations. Sunscreen needs to be reapplied more often than repellents, so the combination products can result in overexposure to pesticides.  *  Wash off all repellent from skin and clothing immediately after coming indoors.  Area-wide repellent strategies can also be effective for outdoor gatherings. There are various contraptions available that emit carbon dioxide to trap mosquitoes (such as the Mosquito Magnet and Mosquito Deleto). These are expensive, but they do work, and some companies will even rent them to you for an outdoor event. Citronella candles are also effective when there is no breeze, but beware of candles containing pesticides - the smoke is easily inhaled and can irritate the airway. Placing fans around your porch or patio can blow mosquitoes away.  Keep in mind that only female mosquitoes actually bite and that most mosquito species in this area do not transmit West Nile virus. You are most at risk of being bitten by a mosquito carrying the disease at dawn and dusk, and even in these cases your chances of actually contracting the virus are extremely low. So take sensible steps to keep the buggers under control, but also keep them in perspective as the annoyances they are.

## 2016-07-02 NOTE — Progress Notes (Signed)
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 Non-allergic rhinitis - Testing today showed: negative to our entire panel  - We did not do intradermal testing since your symptoms were not very severe.  - Continue with over the counter antihistamines as needed.   2. Large local reactions to insect stings (mosquitos, wasp, hornets, bees, and fire ants) - The reaction to the mosquitos is merely a nuisance and unlikely to cause any severe reactions. - This is known as Warehouse manager syndrome (informational handout provided). - We will send in a prescription for triamcinolone ointment to use as needed for severe rashes/swellings, which might help decrease the duration of symptoms.  - Change from benadryl to Zyrtec (cetirizine) as needed for the itching.  - We will schedule you for a Venom Day for testing.  - These visits are done quarterly because the testing supplies have a limited life span once they are thawed out.   3. Return in about 3 months (around 10/02/2016) for VENOM TESTING.  Subjective:   Samantha Pope is a 56 y.o. female presenting today for evaluation of  Chief Complaint  Patient presents with  . Allergy Testing  . Other    patient stated thtat she swells after and insect bite.    Samantha Pope has a history of the following: Patient Active Problem List   Diagnosis Date Noted  . Non-allergic rhinitis 07/02/2016  . Insect sting allergy, current reaction, accidental or unintentional, subsequent encounter 07/02/2016  . Immunocompromised patient ([place]) 07/02/2016  . 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 Anxiety and depression 07/08/2012  . Migraine 07/08/2012  . Vertigo 07/08/2012  . IBS (irritable bowel syndrome) 07/08/2012  . Other nonspecific abnormal result of function  study of brain and central nervous system 04/15/2012  . Arteritis, unspecified ([place]) 04/15/2012  . Dizziness and giddiness 04/15/2012    History obtained from: chart review and patient.  Samantha Pope was referred by Leeroy Cha, MD.     Harrison is a 56 y.o. female presenting for an exaggerated responses to insect bites. She reports severe reactions since she was a child. She is bit, then it itches and she swells. Symptoms continue for 1-2 weeks. She treats with diphenhydramine as well as hydrocortisone cream. She was given an EpiPen after the last mosquito bite developed into an abscess. This was treated with antibiotic for the abscess, but this is the firs time that she required treatment with antibiotics. She denies any systemic symptoms whatsoever. She does develop large local reactions to bees, hornets, and wasps. She has been stung by fire ants and does develop swelling.   Ms. Conkle does have a history of allergic rhinitis. She has rhinorrhea and itchy eyes in the spring and the fall. She treats with OTC antihistamines with improvement. She has been allergy tested years ago and she was positive to dust and dog at that time. She does have two dogs at home.    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 She is followed by a Rheumatologist back in [place] where she was initially diagnosed. She initially presented with oral and genital ulcers, and a workup for IBD was negative.    She does have a daughter with Celiac disease and two other daughters who have removed gluten from digestive concerns. Otherwise, there is  no history of other atopic diseases, including asthma, drug allergies, food allergies, environmental allergies, stinging insect allergies, or urticaria. There is no significant infectious history. Vaccinations are up to date.    Past Medical History: Patient Active Problem List   Diagnosis Date Noted  . Non-allergic rhinitis  07/02/2016  . Insect sting allergy, current reaction, accidental or unintentional, subsequent encounter 07/02/2016  . Immunocompromised patient ([place]) 07/02/2016  . 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 Anxiety and depression 07/08/2012  . Migraine 07/08/2012  . Vertigo 07/08/2012  . IBS (irritable bowel syndrome) 07/08/2012  . Other nonspecific abnormal result of function study of brain and central nervous system 04/15/2012  . Arteritis, unspecified ([place]) 04/15/2012  . Dizziness and giddiness 04/15/2012    Medication List:  Allergies as of 07/02/2016      Reactions   Erythromycin Hives   Penicillins Hives, Rash   Hives      Medication List       Accurate as of 07/02/16 11:51 AM. Always use your most recent med list.          azaTHIOprine 50 MG tablet Commonly known as:  IMURAN Take 50 mg by mouth daily.   Co Q 10 10 MG Caps Take 100 mg by mouth every morning.   Vitamin D3 5000 units Caps Take by mouth.   D3 DOTS 2000 units Tbdp Generic drug:  Cholecalciferol Take 2,000 Units by mouth 2 (two) times daily.   EPIPEN 2-PAK 0.3 mg/0.3 mL Soaj injection Generic drug:  EPINEPHrine AS DIRECTED ONCE INJECTION 360 DAYS   ESTROVEN EXTRA STRENGTH Tabs Take by mouth every morning.   fish oil-omega-3 fatty acids 1000 MG capsule Take 2 g by mouth 2 (two) times daily.   hyoscyamine 0.375 MG 12 hr tablet Commonly known as:  LEVBID Take 1 tablet (0.375 mg total) by mouth every 12 (twelve) hours as needed.   levETIRAcetam 500 MG 24 hr tablet Commonly known as:  KEPPRA XR   magnesium aspartate 615 MG tablet Commonly known as:  MAGINEX Take 615 mg by mouth 2 (two) times daily. 3 at night   meloxicam 15 MG tablet Commonly known as:  MOBIC Take 15 mg by mouth daily.   methylPREDNISolone 4 MG Tbpk tablet Commonly known as:  MEDROL Tapering 6 day dose pack   multivitamin tablet Take 1 tablet by mouth daily.   naproxen sodium 550 MG tablet Commonly known as:   ANAPROX Take 550 mg by mouth 2 (two) times daily with a meal.   OVER THE COUNTER MEDICATION Vegetarian capsules   OVER THE COUNTER MEDICATION 3 happy calm focused in the am   OVER THE COUNTER MEDICATION thyrosine (THYROID SUPPORT FORMULA)   OVER THE COUNTER MEDICATION Take 1 capsule by mouth 2 (two) times daily. Tumeric   pentoxifylline 400 MG CR tablet Commonly known as:  TRENTAL Take 400 mg by mouth 2 (two) times daily.       Birth History: non-contributory. Born at term without complications.   Developmental History: non-contributory.   Past Surgical History: Past Surgical History:  Procedure Laterality Date  . BREAST BIOPSY  1980   benign  . [place]   as a baby  . LASIK  2014  . oral implant  2014     Family History: Family History  Problem Relation Age of Onset  . Prostate cancer Father   . Heart disease Father        a. fib  . Parkinson's disease Father   .  Alcoholism Brother   . Allergic rhinitis Brother   . Brain cancer Brother   . Arthritis Unknown   . Colon cancer Paternal Grandfather 36  . Stroke Paternal Grandmother        in 44s  . Angioedema Neg Hx   . Asthma Neg Hx   . Atopy Neg Hx   . Eczema Neg Hx   . Immunodeficiency Neg Hx   . Urticaria Neg Hx      Social History: Girtie lives at home with her mother and her husband. There are two dogs at home. She does have four daughters ranging in age from 72 to 25. 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 They live in a house that is 55 years old. There is carpeting throughout the home. They have electric heating and central cooling. There are dust mite coverings on the bedding. There is no smoking exposure. 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 She started in September 2017. Although her husband lived here since 2000, she moved here permanently in 2015. Prior to this, she was living and working in [place].    Review of Systems: a 14-point review of systems  is pertinent for what is mentioned in HPI.  Otherwise, all other systems were negative. Constitutional: negative other than that listed in the HPI Eyes: negative other than that listed in the HPI Ears, nose, mouth, throat, and face: negative other than that listed in the HPI Respiratory: negative other than that listed in the HPI Cardiovascular: negative other than that listed in the HPI Gastrointestinal: negative other than that listed in the HPI Genitourinary: negative other than that listed in the HPI Integument: negative other than that listed in the HPI Hematologic: negative other than that listed in the HPI Musculoskeletal: negative other than that listed in the HPI Neurological: negative other than that listed in the HPI Allergy/Immunologic: negative other than that listed in the HPI    Objective:   Blood pressure 110/90, pulse 67, temperature 98.1 F (36.7 C), temperature source Oral, resp. rate 16, height 5' 7.32" (1.71 m), weight 189 lb (85.7 kg), last menstrual period 07/21/2011, SpO2 98 %. Body mass index is 29.32 kg/m.   Physical Exam:  General: Alert, interactive, in no acute distress.Pleasant female. Interactive. Eyes: No conjunctival injection present on the right, No conjunctival injection present on the left, PERRL bilaterally, No discharge on the right, No discharge on the left, No Horner-Trantas dots present and allergic shiners present bilaterally Ears: Right TM pearly gray with normal light reflex, Left TM pearly gray with normal light reflex, Right TM intact without perforation and Left TM intact without perforation.  Nose/Throat: External nose within normal limits and septum midline, turbinates edematous without discharge, post-pharynx mildly erythematous without cobblestoning in the posterior oropharynx. Tonsils 2+ without exudates Neck: Supple without thyromegaly.  Adenopathy: no enlarged lymph nodes appreciated in the anterior cervical, occipital, axillary,  epitrochlear, inguinal, or popliteal regions Lungs: Clear to auscultation without wheezing, rhonchi or rales. No increased work of breathing. CV: Normal S1/S2, no murmurs. Capillary refill <2 seconds.  Abdomen: Nondistended, nontender. No guarding or rebound tenderness. Bowel sounds present in all fields and hyperactive  Skin: Warm and dry, without lesions or rashes. Extremities:  No clubbing, cyanosis or edema. Neuro:   Grossly intact. No focal deficits appreciated. Responsive to questions.  Diagnostic studies:   Allergy Studies:   Indoor/Outdoor Percutaneous Adult Environmental Panel: negative to the entire panel with adequate controls.  Deferred intradermal testing  since her symptoms were not severe and she had no intention of starting allergy shots.      Salvatore Marvel, MD [facility]

## 2016-10-13 ENCOUNTER — Other Ambulatory Visit: Payer: Self-pay | Admitting: Internal Medicine

## 2016-10-13 ENCOUNTER — Other Ambulatory Visit (HOSPITAL_COMMUNITY)
Admission: RE | Admit: 2016-10-13 | Discharge: 2016-10-13 | Disposition: A | Discharge status: Home / Self Care | Payer: Commercial Managed Care - PPO | Source: Ambulatory Visit | Attending: Internal Medicine | Admitting: Internal Medicine

## 2016-10-13 DIAGNOSIS — Z124 Encounter for screening for malignant neoplasm of cervix: Secondary | ICD-10-CM | POA: Insufficient documentation

## 2016-10-16 LAB — CYTOLOGY - PAP
CHLAMYDIA, DNA PROBE: NEGATIVE
Diagnosis: NEGATIVE
HPV: NOT DETECTED
NEISSERIA GONORRHEA: NEGATIVE

## 2016-10-28 ENCOUNTER — Other Ambulatory Visit: Payer: Self-pay | Admitting: Internal Medicine

## 2016-10-28 DIAGNOSIS — Z1239 Encounter for other screening for malignant neoplasm of breast: Secondary | ICD-10-CM

## 2016-11-07 ENCOUNTER — Ambulatory Visit
Admission: RE | Admit: 2016-11-07 | Discharge: 2016-11-07 | Disposition: A | Discharge status: Home / Self Care | Payer: Commercial Managed Care - PPO | Source: Ambulatory Visit | Attending: Internal Medicine | Admitting: Internal Medicine

## 2016-11-07 DIAGNOSIS — Z1239 Encounter for other screening for malignant neoplasm of breast: Secondary | ICD-10-CM

## 2016-12-02 ENCOUNTER — Ambulatory Visit (INDEPENDENT_AMBULATORY_CARE_PROVIDER_SITE_OTHER): Payer: Commercial Managed Care - PPO | Admitting: Pediatrics

## 2016-12-02 ENCOUNTER — Encounter: Payer: Self-pay | Admitting: Pediatrics

## 2016-12-02 VITALS — BP 90/60 | HR 73 | Temp 98.1°F | Resp 16

## 2016-12-02 DIAGNOSIS — T63481D Toxic effect of venom of other arthropod, accidental (unintentional), subsequent encounter: Secondary | ICD-10-CM

## 2016-12-02 DIAGNOSIS — T63421A Toxic effect of venom of ants, accidental (unintentional), initial encounter: Secondary | ICD-10-CM | POA: Insufficient documentation

## 2016-12-02 DIAGNOSIS — T63421D Toxic effect of venom of ants, accidental (unintentional), subsequent encounter: Secondary | ICD-10-CM

## 2016-12-02 NOTE — Progress Notes (Signed)
  [place] [id number] Dept: [phone]  FOLLOW UP NOTE  Patient ID: Samantha Pope, female    DOB: 11-04-1960  Age: 56 y.o. MRN: [id number] Date of Office Visit: 12/02/2016  Assessment  Chief Complaint: Allergic Reaction (venom)  HPI Samantha Pope presents for allergy  skin testing to Hymenoptera venoms She has had several large local reactions to insect stings . Recently she had a  large local reaction to a mosquito bite. She has had a history of large local reactions to fire ant bite .   Drug Allergies:  Allergies  Allergen Reactions  . Erythromycin Hives  . Penicillins Hives and Rash    Hives    Physical Exam: BP 90/60   Pulse 73   Temp 98.1 F (36.7 C) (Oral)   Resp 16   LMP 07/21/2011   SpO2 97%    Physical Exam  Constitutional: She is oriented to person, place, and time. She appears well-developed and well-nourished.  HENT:  Eyes normal. Ears normal. Nose normal. Pharynx normal.  Neck: Neck supple.  Cardiovascular:  S1 and S2 normal no murmurs  Pulmonary/Chest:  Clear to percussion and auscultation  Lymphadenopathy:    She has no cervical adenopathy.  Neurological: She is alert and oriented to person, place, and time.  Psychiatric: She has a normal mood and affect. Her behavior is normal. Judgment and thought content normal.  Vitals reviewed.   Diagnostics:  Skin testing to Hymenoptera venoms was negative. Puncture skin test to fire ant was negative  Assessment and Plan: 1. Allergic reaction to insect sting, accidental or unintentional, subsequent encounter   2. Toxic effect of venom of ants, unintentional, subsequent encounter        Patient Instructions  If you have a mosquito bite or a fire ant bite, you may use triamcinolone 0.1% ointment  twice a day and Benadryl cream twice a day. Call us if you are not doing well on this treatment plan  If you have an allergic reaction take Benadryl 50 mg every 4 hours and if you have  life-threatening symptoms inject with EpiPen  0.3 milligrams I will call you with the results of your blood tests for mosquito allergy and fire ant allergy   Return if symptoms worsen or fail to improve.    Thank you for the opportunity to care for this patient.  Please do not hesitate to contact me with questions.  Penne Lash, M.D.  [facility] [address] [place], [place] [address] [phone]

## 2016-12-02 NOTE — Patient Instructions (Addendum)
If you have a mosquito bite or a fire ant bite, you may use triamcinolone 0.1% ointment  twice a day and Benadryl cream twice a day. Call us if you are not doing well on this treatment plan  If you have an allergic reaction take Benadryl 50 mg every 4 hours and if you have life-threatening symptoms inject with EpiPen  0.3 milligrams I will call you with the results of your blood tests for mosquito allergy and fire ant allergy

## 2017-09-24 ENCOUNTER — Other Ambulatory Visit: Payer: Self-pay | Admitting: Internal Medicine

## 2017-09-24 DIAGNOSIS — Z1231 Encounter for screening mammogram for malignant neoplasm of breast: Secondary | ICD-10-CM

## 2017-11-10 ENCOUNTER — Ambulatory Visit
Admission: RE | Admit: 2017-11-10 | Discharge: 2017-11-10 | Disposition: A | Discharge status: Home / Self Care | Payer: Commercial Managed Care - PPO | Source: Ambulatory Visit | Attending: Internal Medicine | Admitting: Internal Medicine

## 2017-11-10 DIAGNOSIS — Z1231 Encounter for screening mammogram for malignant neoplasm of breast: Secondary | ICD-10-CM

## 2018-02-22 DIAGNOSIS — J209 Acute bronchitis, unspecified: Secondary | ICD-10-CM | POA: Diagnosis not present

## 2018-02-22 DIAGNOSIS — J019 Acute sinusitis, unspecified: Secondary | ICD-10-CM | POA: Diagnosis not present

## 2018-02-22 DIAGNOSIS — R05 Cough: Secondary | ICD-10-CM | POA: Diagnosis not present

## 2018-03-09 DIAGNOSIS — Z79899 Other long term (current) drug therapy: Secondary | ICD-10-CM | POA: Diagnosis not present

## 2018-03-09 DIAGNOSIS — H43821 Vitreomacular adhesion, right eye: Secondary | ICD-10-CM | POA: Diagnosis not present

## 2018-03-09 DIAGNOSIS — M352 Behcet's disease: Secondary | ICD-10-CM | POA: Diagnosis not present

## 2018-03-09 DIAGNOSIS — Z9889 Other specified postprocedural states: Secondary | ICD-10-CM | POA: Diagnosis not present

## 2018-03-16 DIAGNOSIS — D1801 Hemangioma of skin and subcutaneous tissue: Secondary | ICD-10-CM | POA: Diagnosis not present

## 2018-03-16 DIAGNOSIS — D225 Melanocytic nevi of trunk: Secondary | ICD-10-CM | POA: Diagnosis not present

## 2018-03-16 DIAGNOSIS — L821 Other seborrheic keratosis: Secondary | ICD-10-CM | POA: Diagnosis not present

## 2018-03-29 DIAGNOSIS — Z79899 Other long term (current) drug therapy: Secondary | ICD-10-CM | POA: Diagnosis not present

## 2018-03-29 DIAGNOSIS — E638 Other specified nutritional deficiencies: Secondary | ICD-10-CM | POA: Diagnosis not present

## 2018-03-29 DIAGNOSIS — M352 Behcet's disease: Secondary | ICD-10-CM | POA: Diagnosis not present

## 2018-05-03 DIAGNOSIS — H66001 Acute suppurative otitis media without spontaneous rupture of ear drum, right ear: Secondary | ICD-10-CM | POA: Diagnosis not present

## 2019-11-16 ENCOUNTER — Ambulatory Visit (INDEPENDENT_AMBULATORY_CARE_PROVIDER_SITE_OTHER): Payer: Commercial Managed Care - PPO | Admitting: Psychologist

## 2019-11-16 DIAGNOSIS — F330 Major depressive disorder, recurrent, mild: Secondary | ICD-10-CM

## 2019-11-16 DIAGNOSIS — F411 Generalized anxiety disorder: Secondary | ICD-10-CM

## 2019-12-01 ENCOUNTER — Ambulatory Visit (INDEPENDENT_AMBULATORY_CARE_PROVIDER_SITE_OTHER): Payer: BC Managed Care – PPO | Admitting: Psychologist

## 2019-12-01 DIAGNOSIS — F330 Major depressive disorder, recurrent, mild: Secondary | ICD-10-CM

## 2019-12-01 DIAGNOSIS — F411 Generalized anxiety disorder: Secondary | ICD-10-CM | POA: Diagnosis not present

## 2019-12-13 ENCOUNTER — Ambulatory Visit (INDEPENDENT_AMBULATORY_CARE_PROVIDER_SITE_OTHER): Payer: BC Managed Care – PPO | Admitting: Psychologist

## 2019-12-13 DIAGNOSIS — F330 Major depressive disorder, recurrent, mild: Secondary | ICD-10-CM | POA: Diagnosis not present

## 2019-12-13 DIAGNOSIS — F411 Generalized anxiety disorder: Secondary | ICD-10-CM

## 2019-12-27 ENCOUNTER — Ambulatory Visit: Payer: BC Managed Care – PPO | Admitting: Psychologist

## 2020-01-06 ENCOUNTER — Other Ambulatory Visit: Payer: Commercial Managed Care - PPO

## 2020-01-06 DIAGNOSIS — Z20822 Contact with and (suspected) exposure to covid-19: Secondary | ICD-10-CM

## 2020-01-08 LAB — NOVEL CORONAVIRUS, NAA: SARS-CoV-2, NAA: NOT DETECTED

## 2020-01-08 LAB — SARS-COV-2, NAA 2 DAY TAT

## 2020-01-16 ENCOUNTER — Ambulatory Visit (INDEPENDENT_AMBULATORY_CARE_PROVIDER_SITE_OTHER): Payer: BC Managed Care – PPO | Admitting: Psychologist

## 2020-01-16 DIAGNOSIS — F411 Generalized anxiety disorder: Secondary | ICD-10-CM | POA: Diagnosis not present

## 2020-01-16 DIAGNOSIS — F330 Major depressive disorder, recurrent, mild: Secondary | ICD-10-CM | POA: Diagnosis not present

## 2020-12-18 ENCOUNTER — Other Ambulatory Visit: Payer: Self-pay | Admitting: Internal Medicine

## 2020-12-18 DIAGNOSIS — Z1231 Encounter for screening mammogram for malignant neoplasm of breast: Secondary | ICD-10-CM

## 2021-01-17 ENCOUNTER — Ambulatory Visit
Admission: RE | Admit: 2021-01-17 | Discharge: 2021-01-17 | Disposition: A | Discharge status: Home / Self Care | Payer: Commercial Managed Care - PPO | Source: Ambulatory Visit | Attending: Internal Medicine | Admitting: Internal Medicine

## 2021-01-17 ENCOUNTER — Other Ambulatory Visit: Payer: Self-pay

## 2021-01-17 DIAGNOSIS — Z1231 Encounter for screening mammogram for malignant neoplasm of breast: Secondary | ICD-10-CM

## 2021-01-23 ENCOUNTER — Other Ambulatory Visit: Payer: Self-pay | Admitting: Internal Medicine

## 2021-01-23 DIAGNOSIS — R928 Other abnormal and inconclusive findings on diagnostic imaging of breast: Secondary | ICD-10-CM

## 2021-02-21 ENCOUNTER — Other Ambulatory Visit: Payer: Commercial Managed Care - PPO

## 2021-03-04 DIAGNOSIS — N951 Menopausal and female climacteric states: Secondary | ICD-10-CM | POA: Diagnosis not present

## 2021-03-14 DIAGNOSIS — F339 Major depressive disorder, recurrent, unspecified: Secondary | ICD-10-CM | POA: Diagnosis not present

## 2021-03-14 DIAGNOSIS — F4320 Adjustment disorder, unspecified: Secondary | ICD-10-CM | POA: Diagnosis not present

## 2021-03-15 DIAGNOSIS — Z124 Encounter for screening for malignant neoplasm of cervix: Secondary | ICD-10-CM | POA: Diagnosis not present

## 2021-03-15 DIAGNOSIS — Z6832 Body mass index (BMI) 32.0-32.9, adult: Secondary | ICD-10-CM | POA: Diagnosis not present

## 2021-03-15 DIAGNOSIS — Z01411 Encounter for gynecological examination (general) (routine) with abnormal findings: Secondary | ICD-10-CM | POA: Diagnosis not present

## 2021-03-15 DIAGNOSIS — Z113 Encounter for screening for infections with a predominantly sexual mode of transmission: Secondary | ICD-10-CM | POA: Diagnosis not present

## 2021-03-15 DIAGNOSIS — Z01419 Encounter for gynecological examination (general) (routine) without abnormal findings: Secondary | ICD-10-CM | POA: Diagnosis not present

## 2021-03-21 DIAGNOSIS — F339 Major depressive disorder, recurrent, unspecified: Secondary | ICD-10-CM | POA: Diagnosis not present

## 2021-03-21 DIAGNOSIS — F4320 Adjustment disorder, unspecified: Secondary | ICD-10-CM | POA: Diagnosis not present

## 2021-03-26 ENCOUNTER — Ambulatory Visit
Admission: RE | Admit: 2021-03-26 | Discharge: 2021-03-26 | Disposition: A | Discharge status: Home / Self Care | Payer: Commercial Managed Care - PPO | Source: Ambulatory Visit | Attending: Internal Medicine | Admitting: Internal Medicine

## 2021-03-26 ENCOUNTER — Other Ambulatory Visit: Payer: Self-pay | Admitting: Internal Medicine

## 2021-03-26 DIAGNOSIS — R928 Other abnormal and inconclusive findings on diagnostic imaging of breast: Secondary | ICD-10-CM

## 2021-03-26 DIAGNOSIS — H43821 Vitreomacular adhesion, right eye: Secondary | ICD-10-CM | POA: Diagnosis not present

## 2021-03-26 DIAGNOSIS — Z79899 Other long term (current) drug therapy: Secondary | ICD-10-CM | POA: Diagnosis not present

## 2021-03-26 DIAGNOSIS — M352 Behcet's disease: Secondary | ICD-10-CM | POA: Diagnosis not present

## 2021-03-26 DIAGNOSIS — R921 Mammographic calcification found on diagnostic imaging of breast: Secondary | ICD-10-CM

## 2021-03-26 DIAGNOSIS — Z9889 Other specified postprocedural states: Secondary | ICD-10-CM | POA: Diagnosis not present

## 2021-03-28 DIAGNOSIS — F339 Major depressive disorder, recurrent, unspecified: Secondary | ICD-10-CM | POA: Diagnosis not present

## 2021-03-28 DIAGNOSIS — F4320 Adjustment disorder, unspecified: Secondary | ICD-10-CM | POA: Diagnosis not present

## 2021-04-04 DIAGNOSIS — F4320 Adjustment disorder, unspecified: Secondary | ICD-10-CM | POA: Diagnosis not present

## 2021-04-04 DIAGNOSIS — F339 Major depressive disorder, recurrent, unspecified: Secondary | ICD-10-CM | POA: Diagnosis not present

## 2021-04-05 ENCOUNTER — Ambulatory Visit
Admission: RE | Admit: 2021-04-05 | Discharge: 2021-04-05 | Disposition: A | Discharge status: Home / Self Care | Payer: Commercial Managed Care - PPO | Source: Ambulatory Visit | Attending: Internal Medicine | Admitting: Internal Medicine

## 2021-04-05 ENCOUNTER — Other Ambulatory Visit: Payer: Self-pay

## 2021-04-05 DIAGNOSIS — R921 Mammographic calcification found on diagnostic imaging of breast: Secondary | ICD-10-CM

## 2021-04-18 DIAGNOSIS — F4320 Adjustment disorder, unspecified: Secondary | ICD-10-CM | POA: Diagnosis not present

## 2021-04-18 DIAGNOSIS — F339 Major depressive disorder, recurrent, unspecified: Secondary | ICD-10-CM | POA: Diagnosis not present

## 2021-04-24 DIAGNOSIS — F4320 Adjustment disorder, unspecified: Secondary | ICD-10-CM | POA: Diagnosis not present

## 2021-04-24 DIAGNOSIS — F339 Major depressive disorder, recurrent, unspecified: Secondary | ICD-10-CM | POA: Diagnosis not present

## 2021-05-02 DIAGNOSIS — F4320 Adjustment disorder, unspecified: Secondary | ICD-10-CM | POA: Diagnosis not present

## 2021-05-02 DIAGNOSIS — F339 Major depressive disorder, recurrent, unspecified: Secondary | ICD-10-CM | POA: Diagnosis not present

## 2021-05-16 DIAGNOSIS — F4320 Adjustment disorder, unspecified: Secondary | ICD-10-CM | POA: Diagnosis not present

## 2021-05-16 DIAGNOSIS — F339 Major depressive disorder, recurrent, unspecified: Secondary | ICD-10-CM | POA: Diagnosis not present

## 2021-05-30 DIAGNOSIS — F339 Major depressive disorder, recurrent, unspecified: Secondary | ICD-10-CM | POA: Diagnosis not present

## 2021-05-30 DIAGNOSIS — F4320 Adjustment disorder, unspecified: Secondary | ICD-10-CM | POA: Diagnosis not present

## 2021-06-13 DIAGNOSIS — F4320 Adjustment disorder, unspecified: Secondary | ICD-10-CM | POA: Diagnosis not present

## 2021-06-13 DIAGNOSIS — F339 Major depressive disorder, recurrent, unspecified: Secondary | ICD-10-CM | POA: Diagnosis not present

## 2021-06-28 DIAGNOSIS — F4320 Adjustment disorder, unspecified: Secondary | ICD-10-CM | POA: Diagnosis not present

## 2021-06-28 DIAGNOSIS — F339 Major depressive disorder, recurrent, unspecified: Secondary | ICD-10-CM | POA: Diagnosis not present

## 2021-07-18 DIAGNOSIS — F4320 Adjustment disorder, unspecified: Secondary | ICD-10-CM | POA: Diagnosis not present

## 2021-07-18 DIAGNOSIS — F339 Major depressive disorder, recurrent, unspecified: Secondary | ICD-10-CM | POA: Diagnosis not present

## 2021-07-25 DIAGNOSIS — M352 Behcet's disease: Secondary | ICD-10-CM | POA: Diagnosis not present

## 2021-07-25 DIAGNOSIS — H9313 Tinnitus, bilateral: Secondary | ICD-10-CM | POA: Diagnosis not present

## 2021-07-25 DIAGNOSIS — W57XXXA Bitten or stung by nonvenomous insect and other nonvenomous arthropods, initial encounter: Secondary | ICD-10-CM | POA: Diagnosis not present

## 2021-07-31 DIAGNOSIS — F339 Major depressive disorder, recurrent, unspecified: Secondary | ICD-10-CM | POA: Diagnosis not present

## 2021-07-31 DIAGNOSIS — F4320 Adjustment disorder, unspecified: Secondary | ICD-10-CM | POA: Diagnosis not present

## 2021-08-15 DIAGNOSIS — F4320 Adjustment disorder, unspecified: Secondary | ICD-10-CM | POA: Diagnosis not present

## 2021-08-15 DIAGNOSIS — F339 Major depressive disorder, recurrent, unspecified: Secondary | ICD-10-CM | POA: Diagnosis not present

## 2021-08-28 DIAGNOSIS — M352 Behcet's disease: Secondary | ICD-10-CM | POA: Diagnosis not present

## 2021-08-28 DIAGNOSIS — Z91038 Other insect allergy status: Secondary | ICD-10-CM | POA: Diagnosis not present

## 2021-09-05 DIAGNOSIS — F4320 Adjustment disorder, unspecified: Secondary | ICD-10-CM | POA: Diagnosis not present

## 2021-09-05 DIAGNOSIS — F339 Major depressive disorder, recurrent, unspecified: Secondary | ICD-10-CM | POA: Diagnosis not present

## 2021-09-18 DIAGNOSIS — F4320 Adjustment disorder, unspecified: Secondary | ICD-10-CM | POA: Diagnosis not present

## 2021-09-18 DIAGNOSIS — F339 Major depressive disorder, recurrent, unspecified: Secondary | ICD-10-CM | POA: Diagnosis not present

## 2021-09-22 ENCOUNTER — Ambulatory Visit
Admission: RE | Admit: 2021-09-22 | Discharge: 2021-09-22 | Disposition: A | Discharge status: Home / Self Care | Payer: Commercial Managed Care - PPO | Source: Ambulatory Visit | Attending: Urgent Care | Admitting: Urgent Care

## 2021-09-22 VITALS — BP 102/70 | HR 72 | Temp 98.0°F | Resp 16

## 2021-09-22 DIAGNOSIS — L299 Pruritus, unspecified: Secondary | ICD-10-CM

## 2021-09-22 DIAGNOSIS — L249 Irritant contact dermatitis, unspecified cause: Secondary | ICD-10-CM

## 2021-09-22 MED ORDER — PREDNISONE 20 MG PO TABS: ORAL_TABLET | ORAL | 0 refills | Status: AC

## 2021-09-22 MED ORDER — DOXYCYCLINE HYCLATE 100 MG PO CAPS: 100.0000 mg | ORAL_CAPSULE | Freq: Two times a day (BID) | ORAL | 0 refills | Status: DC

## 2021-09-22 MED ORDER — HYDROXYZINE HCL 25 MG PO TABS
12.5000 mg | ORAL_TABLET | Freq: Three times a day (TID) | ORAL | 0 refills | Status: DC | PRN
Start: 2021-09-22 — End: 2023-02-10

## 2021-09-22 NOTE — ED Triage Notes (Signed)
Pt here with multiple unknown insect bites. Pt was out in woods 4 days ago and reaction started yesterday. Itching, pain and swelling are present.

## 2021-09-22 NOTE — ED Provider Notes (Signed)
[place]   MRN: [id number] DOB: 1961/01/15  Subjective:   JALEEYA Pope is a 61 y.o. female presenting for 4-day history of acute onset persistent worsening itchy and painful spots over her lower legs.  Patient works as a Cabin crew and has spent a lot of time out in the field lately and woods.  Does not know of anything specific that bit her wrist causing the problem.  Has been using Benadryl with minimal relief.  No current facility-administered medications for this encounter.  Current Outpatient Medications:    azaTHIOprine (IMURAN) 50 MG tablet, Take 50 mg by mouth daily. , Disp: , Rfl:    Cholecalciferol (D3 DOTS) 2000 UNITS TBDP, Take 2,000 Units by mouth 2 (two) times daily. , Disp: , Rfl:    Cholecalciferol (VITAMIN D3) 5000 units CAPS, Take by mouth., Disp: , Rfl:    Coenzyme Q10 (CO Q 10) 10 MG CAPS, Take 100 mg by mouth every morning., Disp: , Rfl:    EPIPEN 2-PAK 0.3 MG/0.3ML SOAJ injection, AS DIRECTED ONCE INJECTION 360 DAYS, Disp: , Rfl: 1   fish oil-omega-3 fatty acids 1000 MG capsule, Take 2 g by mouth 2 (two) times daily., Disp: , Rfl:    hyoscyamine (LEVBID) 0.375 MG 12 hr tablet, Take 1 tablet (0.375 mg total) by mouth every 12 (twelve) hours as needed., Disp: 180 tablet, Rfl: 1   levETIRAcetam (KEPPRA XR) 500 MG 24 hr tablet, , Disp: , Rfl:    magnesium aspartate (MAGINEX) 615 MG tablet, Take 615 mg by mouth 2 (two) times daily. 3 at night, Disp: , Rfl:    meloxicam (MOBIC) 15 MG tablet, Take 15 mg by mouth daily., Disp: , Rfl:    methylPREDNISolone (MEDROL) 4 MG TBPK tablet, Tapering 6 day dose pack (Patient not taking: Reported on 07/02/2016), Disp: 21 tablet, Rfl: 0   Multiple Vitamin (MULTIVITAMIN) tablet, Take 1 tablet by mouth daily., Disp: , Rfl:    naproxen sodium (ANAPROX) 550 MG tablet, Take 550 mg by mouth 2 (two) times daily with a meal., Disp: , Rfl:    Nutritional Supplements (ESTROVEN EXTRA STRENGTH) TABS, Take by mouth every  morning., Disp: , Rfl:    OVER THE COUNTER MEDICATION, Vegetarian capsules, Disp: , Rfl:    OVER THE COUNTER MEDICATION, 3 happy calm focused in the am, Disp: , Rfl:    OVER THE COUNTER MEDICATION, thyrosine (THYROID SUPPORT FORMULA), Disp: , Rfl:    OVER THE COUNTER MEDICATION, Take 1 capsule by mouth 2 (two) times daily. Tumeric, Disp: , Rfl:    pentoxifylline (TRENTAL) 400 MG CR tablet, Take 400 mg by mouth 2 (two) times daily. , Disp: , Rfl:    triamcinolone ointment (KENALOG) 0.1 %, Apply 1 application topically 2 (two) times daily., Disp: 30 g, Rfl: 2   Allergies  Allergen Reactions   Erythromycin Hives   Penicillins Hives and Rash    Hives    Past Medical History:  Diagnosis Date   Behcet's syndrome (HCC)    Chicken pox    Depression    Genital ulcer, female    Headache(784.0)    frequent   IBS (irritable bowel syndrome)    Migraines    Mouth ulcer    Obesity    Urine incontinence    Vertigo      Past Surgical History:  Procedure Laterality Date   BREAST BIOPSY  1980   benign   BREAST EXCISIONAL BIOPSY Right 1980   when pt  was 61 years old -  benign    HERNIA REPAIR  1966   as a baby   LASIK  2014   oral implant  2014    Family History  Problem Relation Age of Onset   Prostate cancer Father    Heart disease Father        a. fib   Parkinson's disease Father    Alcoholism Brother    Allergic rhinitis Brother    Brain cancer Brother    Arthritis Unknown    Colon cancer Paternal Grandfather 52   Stroke Paternal Grandmother        in 44s   Angioedema Neg Hx    Asthma Neg Hx    Atopy Neg Hx    Eczema Neg Hx    Immunodeficiency Neg Hx    Urticaria Neg Hx     Social History   Tobacco Use   Smoking status: Never   Smokeless tobacco: Never  Substance Use Topics   Alcohol use: No    Alcohol/week: 0.0 standard drinks of alcohol   Drug use: No    ROS   Objective:   Vitals: BP 102/70 (BP Location: Right Arm)   Pulse 72   Temp 98 F (36.7 C)  (Oral)   Resp 16   LMP 07/21/2011   SpO2 95%   Physical Exam Constitutional:      General: She is not in acute distress.    Appearance: Normal appearance. She is well-developed. She is not ill-appearing, toxic-appearing or diaphoretic.  HENT:     Head: Normocephalic and atraumatic.     Nose: Nose normal.     Mouth/Throat:     Mouth: Mucous membranes are moist.  Eyes:     General: No scleral icterus.       Right eye: No discharge.        Left eye: No discharge.     Extraocular Movements: Extraocular movements intact.  Cardiovascular:     Rate and Rhythm: Normal rate.  Pulmonary:     Effort: Pulmonary effort is normal.  Skin:    General: Skin is warm and dry.     Findings: Rash (Multiple erythematous and some pustular lesions that are pruritic and painful scattered over her lower legs as depicted) present.  Neurological:     General: No focal deficit present.     Mental Status: She is alert and oriented to person, place, and time.  Psychiatric:        Mood and Affect: Mood normal.        Behavior: Behavior normal.       Assessment and Plan :   PDMP not reviewed this encounter.  1. Irritant contact dermatitis, unspecified trigger   2. Itching    Patient has failed both topical and oral over-the-counter measures.  Given the diffuse nature of her symptoms recommended an oral prednisone course.  Will use doxycycline as well as she may have suffered some tick bites.  High suspicion for chigger bites.  However, doxycycline can address secondary skin infections as well.  Hydroxyzine as needed. Counseled patient on potential for adverse effects with medications prescribed/recommended today, ER and return-to-clinic precautions discussed, patient verbalized understanding.    Samantha Pope, [place] 09/22/21 1342

## 2021-10-25 DIAGNOSIS — Z0000 Encounter for general adult medical examination without abnormal findings: Secondary | ICD-10-CM | POA: Diagnosis not present

## 2021-10-25 DIAGNOSIS — M352 Behcet's disease: Secondary | ICD-10-CM | POA: Diagnosis not present

## 2021-10-25 DIAGNOSIS — H905 Unspecified sensorineural hearing loss: Secondary | ICD-10-CM | POA: Diagnosis not present

## 2022-02-25 DIAGNOSIS — H903 Sensorineural hearing loss, bilateral: Secondary | ICD-10-CM | POA: Diagnosis not present

## 2022-02-25 DIAGNOSIS — H9313 Tinnitus, bilateral: Secondary | ICD-10-CM | POA: Diagnosis not present

## 2022-03-25 DIAGNOSIS — H43821 Vitreomacular adhesion, right eye: Secondary | ICD-10-CM | POA: Diagnosis not present

## 2022-03-25 DIAGNOSIS — Z9889 Other specified postprocedural states: Secondary | ICD-10-CM | POA: Diagnosis not present

## 2022-03-25 DIAGNOSIS — M352 Behcet's disease: Secondary | ICD-10-CM | POA: Diagnosis not present

## 2022-03-25 DIAGNOSIS — Z79899 Other long term (current) drug therapy: Secondary | ICD-10-CM | POA: Diagnosis not present

## 2022-08-19 DIAGNOSIS — Z01419 Encounter for gynecological examination (general) (routine) without abnormal findings: Secondary | ICD-10-CM | POA: Diagnosis not present

## 2022-09-09 DIAGNOSIS — R1013 Epigastric pain: Secondary | ICD-10-CM | POA: Diagnosis not present

## 2022-09-09 DIAGNOSIS — K219 Gastro-esophageal reflux disease without esophagitis: Secondary | ICD-10-CM | POA: Diagnosis not present

## 2022-10-31 DIAGNOSIS — Z13220 Encounter for screening for lipoid disorders: Secondary | ICD-10-CM | POA: Diagnosis not present

## 2022-10-31 DIAGNOSIS — H8110 Benign paroxysmal vertigo, unspecified ear: Secondary | ICD-10-CM | POA: Diagnosis not present

## 2022-10-31 DIAGNOSIS — Z91038 Other insect allergy status: Secondary | ICD-10-CM | POA: Diagnosis not present

## 2022-10-31 DIAGNOSIS — Z0000 Encounter for general adult medical examination without abnormal findings: Secondary | ICD-10-CM | POA: Diagnosis not present

## 2022-10-31 DIAGNOSIS — M352 Behcet's disease: Secondary | ICD-10-CM | POA: Diagnosis not present

## 2022-10-31 DIAGNOSIS — Z6831 Body mass index (BMI) 31.0-31.9, adult: Secondary | ICD-10-CM | POA: Diagnosis not present

## 2022-10-31 DIAGNOSIS — K580 Irritable bowel syndrome with diarrhea: Secondary | ICD-10-CM | POA: Diagnosis not present

## 2022-11-01 ENCOUNTER — Other Ambulatory Visit: Payer: Self-pay | Admitting: Internal Medicine

## 2022-11-01 DIAGNOSIS — M85859 Other specified disorders of bone density and structure, unspecified thigh: Secondary | ICD-10-CM

## 2022-11-04 ENCOUNTER — Other Ambulatory Visit: Payer: Self-pay | Admitting: Internal Medicine

## 2022-11-04 DIAGNOSIS — Z1231 Encounter for screening mammogram for malignant neoplasm of breast: Secondary | ICD-10-CM

## 2022-11-06 DIAGNOSIS — J0100 Acute maxillary sinusitis, unspecified: Secondary | ICD-10-CM | POA: Diagnosis not present

## 2022-11-06 DIAGNOSIS — D84821 Immunodeficiency due to drugs: Secondary | ICD-10-CM | POA: Diagnosis not present

## 2022-11-27 ENCOUNTER — Ambulatory Visit
Admission: RE | Admit: 2022-11-27 | Discharge: 2022-11-27 | Disposition: A | Discharge status: Home / Self Care | Payer: Commercial Managed Care - PPO | Source: Ambulatory Visit | Attending: Internal Medicine | Admitting: Internal Medicine

## 2022-11-27 DIAGNOSIS — Z23 Encounter for immunization: Secondary | ICD-10-CM | POA: Diagnosis not present

## 2022-11-27 DIAGNOSIS — M352 Behcet's disease: Secondary | ICD-10-CM | POA: Diagnosis not present

## 2022-11-27 DIAGNOSIS — Z1231 Encounter for screening mammogram for malignant neoplasm of breast: Secondary | ICD-10-CM | POA: Diagnosis not present

## 2022-11-27 DIAGNOSIS — D899 Disorder involving the immune mechanism, unspecified: Secondary | ICD-10-CM | POA: Diagnosis not present

## 2022-11-27 DIAGNOSIS — H6691 Otitis media, unspecified, right ear: Secondary | ICD-10-CM | POA: Diagnosis not present

## 2022-12-02 ENCOUNTER — Other Ambulatory Visit: Payer: Self-pay | Admitting: Internal Medicine

## 2022-12-02 DIAGNOSIS — R928 Other abnormal and inconclusive findings on diagnostic imaging of breast: Secondary | ICD-10-CM

## 2022-12-11 ENCOUNTER — Ambulatory Visit
Admission: RE | Admit: 2022-12-11 | Discharge: 2022-12-11 | Disposition: A | Discharge status: Home / Self Care | Payer: Commercial Managed Care - PPO | Source: Ambulatory Visit | Attending: Internal Medicine | Admitting: Internal Medicine

## 2022-12-11 DIAGNOSIS — R928 Other abnormal and inconclusive findings on diagnostic imaging of breast: Secondary | ICD-10-CM

## 2022-12-11 DIAGNOSIS — N6011 Diffuse cystic mastopathy of right breast: Secondary | ICD-10-CM | POA: Diagnosis not present

## 2022-12-11 DIAGNOSIS — N6311 Unspecified lump in the right breast, upper outer quadrant: Secondary | ICD-10-CM | POA: Diagnosis not present

## 2022-12-17 ENCOUNTER — Encounter: Payer: Self-pay | Admitting: Internal Medicine

## 2022-12-23 ENCOUNTER — Ambulatory Visit (INDEPENDENT_AMBULATORY_CARE_PROVIDER_SITE_OTHER): Payer: Commercial Managed Care - PPO

## 2022-12-23 ENCOUNTER — Encounter: Payer: Self-pay | Admitting: Podiatry

## 2022-12-23 ENCOUNTER — Ambulatory Visit: Payer: Commercial Managed Care - PPO | Admitting: Podiatry

## 2022-12-23 DIAGNOSIS — M722 Plantar fascial fibromatosis: Secondary | ICD-10-CM

## 2022-12-23 DIAGNOSIS — M76821 Posterior tibial tendinitis, right leg: Secondary | ICD-10-CM | POA: Diagnosis not present

## 2022-12-23 DIAGNOSIS — H8120 Vestibular neuronitis, unspecified ear: Secondary | ICD-10-CM | POA: Insufficient documentation

## 2022-12-23 DIAGNOSIS — M778 Other enthesopathies, not elsewhere classified: Secondary | ICD-10-CM

## 2022-12-23 DIAGNOSIS — M76822 Posterior tibial tendinitis, left leg: Secondary | ICD-10-CM | POA: Diagnosis not present

## 2022-12-23 NOTE — Progress Notes (Signed)
Chief Complaint  Patient presents with   Foot Orthotics    Requesting new orthotics - last pair 2016, feeling a little discomfort in arch, so believes she needs new inserts   New Patient (Initial Visit)    Est pt 2016    HPI: 62 y.o. female presents today with recurrence of some bilateral heel and arch pain. She dealt with this in 2016 and was prescribed orthotics at that point they had been doing great for her. She states they are getting worn down and she is noticing recurrence of her symptoms. She tries to avoid barefoot walking but this is especially painful for her.  She denies any nausea, vomiting, fever, chills, chest pain, shortness of breath.  Past Medical History:  Diagnosis Date   Behcet's syndrome (HCC)    Chicken pox    Depression    Genital ulcer, female    Headache(784.0)    frequent   IBS (irritable bowel syndrome)    Migraines    Mouth ulcer    Obesity    Urine incontinence    Vertigo     Past Surgical History:  Procedure Laterality Date   BREAST BIOPSY  1980   benign   BREAST EXCISIONAL BIOPSY Right 1980   when pt was 62 years old -  benign    HERNIA REPAIR  1966   as a baby   LASIK  2014   oral implant  2014    Allergies  Allergen Reactions   Erythromycin Hives   Penicillins Hives and Rash    Hives    ROS negative except as stated in HPI   Physical Exam: There were no vitals filed for this visit.  General: The patient is alert and oriented x3 in no acute distress.  Dermatology: Skin is warm, dry and supple bilateral lower extremities. Interspaces are clear of maceration and debris.    Vascular: Palpable pedal pulses bilaterally. Capillary refill within normal limits.  No appreciable edema.  No erythema or calor.  Neurological: Light touch sensation grossly intact bilateral feet.   Musculoskeletal Exam: Overall rectus foot type.  Pain on palpation of the medial band of plantar fascia at the level of the arch.  Some tenderness on  palpation of the PT tendon.  Muscle strength 5/5 in dorsiflexion, plantarflexion, inversion, eversion.  No pain with double heel rise.  Radiographic Exam: Left and right foot 12/23/2022 Normal osseous mineralization. Joint spaces preserved.  No fractures or osseous irregularities noted.  Overall rectus foot type.  Some spurring of the posterior calcaneus is noted.  Right foot greater than left foot there is some spur formation of the plantar calcaneal tubercle.  Thickening of the plantar fascia is noted bilaterally.  Assessment/Plan of Care: 1. Plantar fasciitis   2. 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  None  Discussed clinical findings with patient today.  # Bilateral plantar fasciitis with posterior tibial tendinitis -Do believe that the patient is having some recurrence of the plantar fasciitis symptoms with some tendinitis of the PT tendon -Overall her symptoms are fairly mild today, will defer injection at this time. -Her symptoms have been managed well with orthotics in the past.  Do think she would benefit from a new pair of custom orthotics. -Discussed stretching and icing regimen for plantar fasciitis and for PT tendinitis. -Would like to see the patient back after receiving custom  orthotics to assess fit and progression.  She may return sooner if symptoms worsen or new pedal complaints arise.   Faithann Natal L. Marchia Bond, AACFAS [facility]     [address] [place], [place] [address]                Office [phone]  Fax [phone]

## 2022-12-23 NOTE — Progress Notes (Signed)
Orthotic eval   Patient was seen, measured / scanned for custom molded foot orthotics.  Patient will benefit from CFO's as they will help provide total contact to MLA's helping to better distribute body weight across BIL feet greater reducing plantar pressure and pain and to also encourage FF and RF alignment. Patient is requesting a low profile orthotic that she can wear in her dressier shoes ordered and sulcus length in leather   Patient was scanned items to be ordered and fit when in  Financials signed

## 2022-12-24 ENCOUNTER — Encounter: Payer: Self-pay | Admitting: Internal Medicine

## 2023-01-08 ENCOUNTER — Ambulatory Visit: Payer: Commercial Managed Care - PPO

## 2023-01-08 NOTE — Progress Notes (Signed)
Patient presents today to pick up custom molded foot orthotics, diagnosed with PF by Dr. Jamse Arn.   Orthotics were dispensed and fit was satisfactory. Reviewed instructions for break-in and wear. Written instructions given to patient.  Patient will follow up as needed. Addison Bailey Cped, CFo, CFm

## 2023-01-15 ENCOUNTER — Ambulatory Visit: Payer: Commercial Managed Care - PPO

## 2023-01-15 VITALS — Ht 67.0 in | Wt 205.0 lb

## 2023-01-15 DIAGNOSIS — Z1211 Encounter for screening for malignant neoplasm of colon: Secondary | ICD-10-CM

## 2023-01-15 MED ORDER — NA SULFATE-K SULFATE-MG SULF 17.5-3.13-1.6 GM/177ML PO SOLN: 1.0000 | Freq: Once | ORAL | 0 refills | Status: AC

## 2023-01-15 NOTE — Progress Notes (Signed)

## 2023-02-07 ENCOUNTER — Encounter: Payer: Self-pay | Admitting: Internal Medicine

## 2023-02-09 ENCOUNTER — Encounter: Payer: Self-pay | Admitting: Internal Medicine

## 2023-02-09 NOTE — Progress Notes (Signed)
 [facility] History and Physical   Primary Care Physician:  Elliot Charm, MD   Reason for Procedure:   CRCA screening  Plan:    colonoscopy     HPI: Samantha Pope is a 63 y.o. female presenting for a screening colonoscopy, last exam 2014.   Past Medical History:  Diagnosis Date   Anxiety    Behcet's syndrome (HCC)    Chicken pox    Depression    Genital ulcer, female    Headache(784.0)    frequent   IBS (irritable bowel syndrome)    Migraines    Mouth ulcer    Obesity    Urine incontinence    Vertigo     Past Surgical History:  Procedure Laterality Date   BREAST BIOPSY  1980   benign   BREAST EXCISIONAL BIOPSY Right 1980   when pt was 63 years old -  benign    HERNIA REPAIR  1966   as a baby   LASIK  2014   oral implant  2014    Prior to Admission medications   Medication Sig Start Date End Date Taking? Authorizing Provider  ascorbic acid (VITAMIN C) 250 MG CHEW 1 tablet Orally Once a day for 30 day(s)    [provider]  aspirin EC 81 MG tablet 1 tablet Orally Once a day    [provider]  azaTHIOprine (IMURAN) 50 MG tablet Take 50 mg by mouth daily.  04/01/12   [provider]  Cholecalciferol (D3 DOTS) 2000 UNITS TBDP Take 2,000 Units by mouth 2 (two) times daily.  Patient not taking: Reported on 01/15/2023    [provider]  Cholecalciferol (VITAMIN D3) 5000 units CAPS Take by mouth.    [provider]  Coenzyme Q10 (CO Q 10) 10 MG CAPS Take 100 mg by mouth every morning.    [provider]  doxycycline  (VIBRA -TABS) 100 MG tablet 1 tablet Orally twice a day for 7 days 11/06/22   [provider]  EPIPEN 2-PAK 0.3 MG/0.3ML SOAJ injection AS DIRECTED ONCE INJECTION 360 DAYS Patient not taking: Reported on 01/15/2023 05/21/16   [provider]  fish oil-omega-3 fatty acids 1000 MG capsule Take 2 g by mouth 2 (two) times daily.    [provider]   Glucosamine-Chondroit-Vit C-Mn (GLUCOSAMINE CHONDROITIN COMPLX) TABS as directed Orally    [provider]  hydrOXYzine  (ATARAX ) 25 MG tablet Take 0.5-1 tablets (12.5-25 mg total) by mouth every 8 (eight) hours as needed for itching. 09/22/21   Christopher Savannah, PA-C  hyoscyamine  (LEVBID ) 0.375 MG 12 hr tablet Take 1 tablet (0.375 mg total) by mouth every 12 (twelve) hours as needed. 03/09/14   Luke Chiquita SAUNDERS, DO  levETIRAcetam (KEPPRA XR) 500 MG 24 hr tablet  06/27/14   [provider]  magnesium aspartate (MAGINEX) 615 MG tablet Take 615 mg by mouth 2 (two) times daily. 3 at night Patient not taking: Reported on 01/15/2023    [provider]  melatonin 3 MG TABS tablet 1 tablet at bedtime as needed with food Orally Once a day    [provider]  meloxicam (MOBIC) 15 MG tablet Take 15 mg by mouth daily.    [provider]  Multiple Vitamin (MULTIVITAMIN) tablet Take 1 tablet by mouth daily.    [provider]  naproxen sodium (ANAPROX) 550 MG tablet Take 550 mg by mouth 2 (two) times daily with a meal.    [provider]  Nutritional  Supplements (ESTROVEN EXTRA STRENGTH) TABS Take by mouth every morning.    [provider]  OVER THE COUNTER MEDICATION Vegetarian capsules Patient not taking: Reported on 01/15/2023    [provider]  OVER THE COUNTER MEDICATION 3 happy calm focused in the am Patient not taking: Reported on 01/15/2023    [provider]  OVER THE COUNTER MEDICATION thyrosine (THYROID SUPPORT FORMULA) Patient not taking: Reported on 01/15/2023    [provider]  OVER THE COUNTER MEDICATION Take 1 capsule by mouth 2 (two) times daily. Tumeric    [provider]  pantoprazole (PROTONIX) 20 MG tablet Take 20 mg by mouth daily. Patient not taking: Reported on 01/15/2023 09/09/22   [provider]  pentoxifylline (TRENTAL) 400 MG CR tablet Take 400 mg by mouth 2 (two) times daily.      [provider]  predniSONE  (DELTASONE ) 20 MG tablet Take 2 tablets daily with breakfast. 09/22/21   Christopher Savannah, PA-C  Probiotic CHEW as directed Orally Twice a day    [provider]  triamcinolone  ointment (KENALOG ) 0.1 % Apply 1 application topically 2 (two) times daily. 07/02/16   Iva Marty Saltness, MD    Current Outpatient Medications  Medication Sig Dispense Refill   ascorbic acid (VITAMIN C) 250 MG CHEW 1 tablet Orally Once a day for 30 day(s)     aspirin EC 81 MG tablet 1 tablet Orally Once a day     azaTHIOprine (IMURAN) 50 MG tablet Take 50 mg by mouth daily.      Cholecalciferol (D3 DOTS) 2000 UNITS TBDP Take 2,000 Units by mouth 2 (two) times daily.     Cholecalciferol (VITAMIN D3) 5000 units CAPS Take by mouth.     Coenzyme Q10 (CO Q 10) 10 MG CAPS Take 100 mg by mouth every morning.     fish oil-omega-3 fatty acids 1000 MG capsule Take 2 g by mouth 2 (two) times daily.     Glucosamine-Chondroit-Vit C-Mn (GLUCOSAMINE CHONDROITIN COMPLX) TABS as directed Orally     melatonin 3 MG TABS tablet 1 tablet at bedtime as needed with food Orally Once a day     Multiple Vitamin (MULTIVITAMIN) tablet Take 1 tablet by mouth daily.     Nutritional Supplements (ESTROVEN EXTRA STRENGTH) TABS Take by mouth every morning.     OVER THE COUNTER MEDICATION Take 1 capsule by mouth 2 (two) times daily. Tumeric     pentoxifylline (TRENTAL) 400 MG CR tablet Take 400 mg by mouth 2 (two) times daily.      Probiotic CHEW as directed Orally Twice a day     EPIPEN 2-PAK 0.3 MG/0.3ML SOAJ injection AS DIRECTED ONCE INJECTION 360 DAYS (Patient not taking: Reported on 01/15/2023)  1   hyoscyamine  (LEVBID ) 0.375 MG 12 hr tablet Take 1 tablet (0.375 mg total) by mouth every 12 (twelve) hours as needed. 180 tablet 1   predniSONE  (DELTASONE ) 20 MG tablet Take 2 tablets daily with breakfast. 10 tablet 0   Current Facility-Administered Medications  Medication Dose Route Frequency Provider  Last Rate Last Admin   0.9 %  sodium chloride  infusion  500 mL Intravenous Once Avram Lupita BRAVO, MD        Allergies as of 02/10/2023 - Review Complete 02/10/2023  Allergen Reaction Noted   Erythromycin Hives 04/19/2012   Penicillins Hives and Rash 03/22/2012    Family History  Problem Relation Age of Onset   Prostate cancer Father    Heart disease Father  a. fib   Parkinson's disease Father    Alcoholism Brother    Allergic rhinitis Brother    Brain cancer Brother    Colon cancer Maternal Grandfather    Stomach cancer Maternal Grandfather    Stroke Paternal Grandmother        in 71s   Colon cancer Paternal Grandfather 78   Arthritis Other    Angioedema Neg Hx    Asthma Neg Hx    Atopy Neg Hx    Eczema Neg Hx    Immunodeficiency Neg Hx    Urticaria Neg Hx    Colon polyps Neg Hx    Rectal cancer Neg Hx     Social History   Socioeconomic History   Marital status: Married    Spouse name: Not on file   Number of children: 4   Years of education: 14   Highest education level: Not on file  Occupational History    Employer: [employer]  Tobacco Use   Smoking status: Never   Smokeless tobacco: Never  Vaping Use   Vaping status: Never Used  Substance and Sexual Activity   Alcohol use: No    Alcohol/week: 0.0 standard drinks of alcohol   Drug use: No   Sexual activity: Yes    Comment: with husabnd  Other Topics Concern   Not on file  Social History Narrative   Work or School: games developer - almost retired      Ecologist Situation: 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  Physical Exam: Vital signs BP 135/83   Pulse 81   Temp 97.7 F (36.5 C)   Ht 5' 7 (1.702 m)   Wt 205 lb (93 kg)   LMP 07/21/2011   SpO2 97%   BMI 32.11 kg/m   General:   Alert,  Well-developed, well-nourished, pleasant and cooperative in NAD Lungs:  Clear throughout to auscultation.   Heart:  Regular rate and rhythm; no murmurs, clicks, rubs,  or gallops. Abdomen:  Soft, nontender and nondistended. Normal bowel sounds.   Neuro/Psych:  Alert and cooperative. Normal mood and affect. A and O x 3   @Kinta Martis  CHARLENA Commander, MD, [facility] [phone] (pager) 02/10/2023 12:19 PM@

## 2023-02-10 ENCOUNTER — Ambulatory Visit: Payer: Commercial Managed Care - PPO | Admitting: Internal Medicine

## 2023-02-10 ENCOUNTER — Encounter: Payer: Self-pay | Admitting: Internal Medicine

## 2023-02-10 VITALS — BP 118/72 | HR 64 | Temp 97.7°F | Resp 10 | Ht 67.0 in | Wt 205.0 lb

## 2023-02-10 DIAGNOSIS — Z1211 Encounter for screening for malignant neoplasm of colon: Secondary | ICD-10-CM

## 2023-02-10 DIAGNOSIS — K635 Polyp of colon: Secondary | ICD-10-CM

## 2023-02-10 DIAGNOSIS — K5730 Diverticulosis of large intestine without perforation or abscess without bleeding: Secondary | ICD-10-CM

## 2023-02-10 DIAGNOSIS — D123 Benign neoplasm of transverse colon: Secondary | ICD-10-CM

## 2023-02-10 HISTORY — PX: COLONOSCOPY: SHX174

## 2023-02-10 MED ORDER — SODIUM CHLORIDE 0.9 % IV SOLN: 500.0000 mL | Freq: Once | INTRAVENOUS | Status: DC

## 2023-02-10 NOTE — Progress Notes (Signed)
 Vss nad trans to pacu

## 2023-02-10 NOTE — Progress Notes (Signed)
 Pt's states no medical or surgical changes since previsit or office visit.

## 2023-02-10 NOTE — Op Note (Signed)
 [facility] Patient Name: Samantha Pope Procedure Date: 02/10/2023 12:15 PM MRN: [id number] Endoscopist: Lupita FORBES Commander , MD, [id number] Age: 63 Referring MD:  Date of Birth: 11-Nov-1960 Gender: Female Account #: [id number] Procedure:                Colonoscopy Indications:              Screening for colorectal malignant neoplasm, Last                            colonoscopy: 2014 Medicines:                Monitored Anesthesia Care Procedure:                Pre-Anesthesia Assessment:                           - Prior to the procedure, a History and Physical                            was performed, and patient medications and                            allergies were reviewed. The patient's tolerance of                            previous anesthesia was also reviewed. The risks                            and benefits of the procedure and the sedation                            options and risks were discussed with the patient.                            All questions were answered, and informed consent                            was obtained. Prior Anticoagulants: The patient has                            taken no anticoagulant or antiplatelet agents. ASA                            Grade Assessment: II - A patient with mild systemic                            disease. After reviewing the risks and benefits,                            the patient was deemed in satisfactory condition to                            undergo the procedure.  After obtaining informed consent, the colonoscope                            was passed under direct vision. Throughout the                            procedure, the patient's blood pressure, pulse, and                            oxygen saturations were monitored continuously. The                            Olympus Scope [id number] was introduced through the                            anus and advanced to the the cecum,  identified by                            appendiceal orifice and ileocecal valve. The                            colonoscopy was performed without difficulty. The                            patient tolerated the procedure well. The quality                            of the bowel preparation was good. The ileocecal                            valve, appendiceal orifice, and rectum were                            photographed. The bowel preparation used was SUPREP                            via split dose instruction. Scope In: 12:25:48 PM Scope Out: 12:39:51 PM Scope Withdrawal Time: 0 hours 11 minutes 37 seconds  Total Procedure Duration: 0 hours 14 minutes 3 seconds  Findings:                 The perianal and digital rectal examinations were                            normal.                           A 12 mm polyp was found in the transverse colon.                            The polyp was flat. The polyp was removed with a                            cold snare. Resection and retrieval were complete.  Verification of patient identification for the                            specimen was done. Estimated blood loss was minimal.                           A few small-mouthed diverticula were found in the                            sigmoid colon.                           The exam was otherwise without abnormality on                            direct and retroflexion views. Complications:            No immediate complications. Estimated Blood Loss:     Estimated blood loss was minimal. Impression:               - One 12 mm polyp in the transverse colon, removed                            with a cold snare. Resected and retrieved.                           - Diverticulosis in the sigmoid colon.                           - The examination was otherwise normal on direct                            and retroflexion views. Recommendation:           - Patient has a contact  number available for                            emergencies. The signs and symptoms of potential                            delayed complications were discussed with the                            patient. Return to normal activities tomorrow.                            Written discharge instructions were provided to the                            patient.                           - Resume previous diet.                           - Continue present medications.                           -  Await pathology results.                           - Repeat colonoscopy is recommended for                            surveillance. The colonoscopy date will be                            determined after pathology results from today's                            exam become available for review. Lupita FORBES Commander, MD 02/10/2023 12:43:20 PM This report has been signed electronically.

## 2023-02-10 NOTE — Progress Notes (Signed)
 Called to room to assist during endoscopic procedure.  Patient ID and intended procedure confirmed with present staff. Received instructions for my participation in the procedure from the performing physician.

## 2023-02-10 NOTE — Patient Instructions (Addendum)
 I found and removed one polyp. I will let you know pathology results and when to have another routine colonoscopy by mail and/or My Chart.  You also have a condition called diverticulosis - common and not usually a problem. Please read the handout provided.  I appreciate the opportunity to care for you. Samantha CHARLENA Commander, MD, [facility]  Handout provided on diverticulosis and polyps.  Resume previous diet and medications.  YOU HAD AN ENDOSCOPIC PROCEDURE TODAY AT THE [facility]:   Refer to the procedure report that was given to you for any specific questions about what was found during the examination.  If the procedure report does not answer your questions, please call your gastroenterologist to clarify.  If you requested that your care partner not be given the details of your procedure findings, then the procedure report has been included in a sealed envelope for you to review at your convenience later.  YOU SHOULD EXPECT: Some feelings of bloating in the abdomen. Passage of more gas than usual.  Walking can help get rid of the air that was put into your GI tract during the procedure and reduce the bloating. If you had a lower endoscopy (such as a colonoscopy or flexible sigmoidoscopy) you may notice spotting of blood in your stool or on the toilet paper. If you underwent a bowel prep for your procedure, you may not have a normal bowel movement for a few days.  Please Note:  You might notice some irritation and congestion in your nose or some drainage.  This is from the oxygen used during your procedure.  There is no need for concern and it should clear up in a day or so.  SYMPTOMS TO REPORT IMMEDIATELY:  Following lower endoscopy (colonoscopy or flexible sigmoidoscopy):  Excessive amounts of blood in the stool  Significant tenderness or worsening of abdominal pains  Swelling of the abdomen that is new, acute  Fever of 100F or higher  Following upper endoscopy (EGD)  Vomiting of  blood or coffee ground material  New chest pain or pain under the shoulder blades  Painful or persistently difficult swallowing  New shortness of breath  Fever of 100F or higher  Black, tarry-looking stools  For urgent or emergent issues, a gastroenterologist can be reached at any hour by calling [phone]. Do not use MyChart messaging for urgent concerns.    DIET:  We do recommend a small meal at first, but then you may proceed to your regular diet.  Drink plenty of fluids but you should avoid alcoholic beverages for 24 hours.  ACTIVITY:  You should plan to take it easy for the rest of today and you should NOT DRIVE or use heavy machinery until tomorrow (because of the sedation medicines used during the test).    FOLLOW UP: Our staff will call the number listed on your records the next business day following your procedure.  We will call around 7:15- 8:00 am to check on you and address any questions or concerns that you may have regarding the information given to you following your procedure. If we do not reach you, we will leave a message.     If any biopsies were taken you will be contacted by phone or by letter within the next 1-3 weeks.  Please call us  at [phone] if you have not heard about the biopsies in 3 weeks.    SIGNATURES/CONFIDENTIALITY: You and/or your care partner have signed paperwork which will be entered into your  electronic medical record.  These signatures attest to the fact that that the information above on your After Visit Summary has been reviewed and is understood.  Full responsibility of the confidentiality of this discharge information lies with you and/or your care-partner.

## 2023-02-11 ENCOUNTER — Telehealth: Payer: Self-pay

## 2023-02-11 NOTE — Telephone Encounter (Signed)
 No answer, left message to call if having any issues or concerns, B.Vale Mousseau RN

## 2023-02-13 LAB — SURGICAL PATHOLOGY

## 2023-02-17 ENCOUNTER — Encounter: Payer: Self-pay | Admitting: Internal Medicine

## 2023-02-17 DIAGNOSIS — Z860100 Personal history of colon polyps, unspecified: Secondary | ICD-10-CM | POA: Insufficient documentation

## 2023-02-17 HISTORY — DX: Personal history of colon polyps, unspecified: Z86.0100

## 2023-05-25 ENCOUNTER — Ambulatory Visit
Admission: RE | Admit: 2023-05-25 | Discharge: 2023-05-25 | Disposition: A | Discharge status: Home / Self Care | Payer: Commercial Managed Care - PPO | Source: Ambulatory Visit | Attending: Internal Medicine | Admitting: Internal Medicine

## 2023-05-25 DIAGNOSIS — M85859 Other specified disorders of bone density and structure, unspecified thigh: Secondary | ICD-10-CM

## 2023-06-09 ENCOUNTER — Emergency Department (HOSPITAL_BASED_OUTPATIENT_CLINIC_OR_DEPARTMENT_OTHER): Admitting: Radiology

## 2023-06-09 ENCOUNTER — Encounter (HOSPITAL_BASED_OUTPATIENT_CLINIC_OR_DEPARTMENT_OTHER): Payer: Self-pay | Admitting: Emergency Medicine

## 2023-06-09 ENCOUNTER — Emergency Department (HOSPITAL_BASED_OUTPATIENT_CLINIC_OR_DEPARTMENT_OTHER)
Admission: EM | Admit: 2023-06-09 | Discharge: 2023-06-09 | Disposition: A | Discharge status: Home / Self Care | Attending: Emergency Medicine | Admitting: Emergency Medicine

## 2023-06-09 DIAGNOSIS — S60042A Contusion of left ring finger without damage to nail, initial encounter: Secondary | ICD-10-CM | POA: Diagnosis present

## 2023-06-09 DIAGNOSIS — Z7982 Long term (current) use of aspirin: Secondary | ICD-10-CM | POA: Insufficient documentation

## 2023-06-09 DIAGNOSIS — W228XXA Striking against or struck by other objects, initial encounter: Secondary | ICD-10-CM | POA: Diagnosis not present

## 2023-06-09 DIAGNOSIS — S6992XA Unspecified injury of left wrist, hand and finger(s), initial encounter: Secondary | ICD-10-CM

## 2023-06-09 NOTE — ED Triage Notes (Signed)
 Left hand ring finger injury.  Slammed into wall while carrying something

## 2023-06-09 NOTE — ED Provider Notes (Signed)
 [place] EMERGENCY DEPARTMENT AT [facility] Provider Note   CSN: [id number] Arrival date & time: 06/09/23  1728     History Chief Complaint  Patient presents with   Finger Injury    Samantha Pope is a 63 y.o. female patient who presents to the emergency department with a left fourth finger injury that occurred prior to arrival.  Patient states she was carrying something when she slammed the finger accidentally into the wall.  Since then she does report associated ecchymosis and swelling.  Pain is worse with movement.  She denies any other injury.  HPI     Home Medications Prior to Admission medications   Medication Sig Start Date End Date Taking? Authorizing Provider  ascorbic acid (VITAMIN C) 250 MG CHEW 1 tablet Orally Once a day for 30 day(s)    [provider]  aspirin EC 81 MG tablet 1 tablet Orally Once a day    [provider]  azaTHIOprine (IMURAN) 50 MG tablet Take 50 mg by mouth daily.  04/01/12   [provider]  Cholecalciferol (D3 DOTS) 2000 UNITS TBDP Take 2,000 Units by mouth 2 (two) times daily.    [provider]  Cholecalciferol (VITAMIN D3) 5000 units CAPS Take by mouth.    [provider]  Coenzyme Q10 (CO Q 10) 10 MG CAPS Take 100 mg by mouth every morning.    [provider]  EPIPEN 2-PAK 0.3 MG/0.3ML SOAJ injection AS DIRECTED ONCE INJECTION 360 DAYS Patient not taking: Reported on 01/15/2023 05/21/16   [provider]  fish oil-omega-3 fatty acids 1000 MG capsule Take 2 g by mouth 2 (two) times daily.    [provider]  Glucosamine-Chondroit-Vit C-Mn (GLUCOSAMINE CHONDROITIN COMPLX) TABS as directed Orally    [provider]  hyoscyamine  (LEVBID) 0.375 MG 12 hr tablet Take 1 tablet (0.375 mg total) by mouth every 12 (twelve) hours as needed. 03/09/14   Maurie Southern, DO  melatonin 3 MG TABS tablet 1 tablet at bedtime as needed with food Orally Once a day    [provider]  Multiple Vitamin (MULTIVITAMIN) tablet Take 1 tablet by mouth daily.    [provider]  Nutritional Supplements (ESTROVEN EXTRA STRENGTH) TABS Take by mouth every morning.    [provider]  OVER THE COUNTER MEDICATION Take 1 capsule by mouth 2 (two) times daily. Tumeric    [provider]  pentoxifylline (TRENTAL) 400 MG CR tablet Take 400 mg by mouth 2 (two) times daily.     [provider]  predniSONE  (DELTASONE ) 20 MG tablet Take 2 tablets daily with breakfast. 09/22/21   Adolph Hoop, PA-C  Probiotic CHEW as directed Orally Twice a day    [provider]      Allergies    Erythromycin and Penicillins    Review of Systems   Review of Systems  All other systems reviewed and are negative.   Physical Exam Updated Vital Signs BP 124/80 (BP Location: Right Arm)   Pulse 63   Temp 98 F (36.7 C) (Oral)   Resp 18   LMP 07/21/2011   SpO2 100%  Physical Exam Vitals and nursing note reviewed.  Constitutional:      Appearance: Normal appearance.  HENT:     Head: Normocephalic and atraumatic.  Eyes:     General:        Right eye: No discharge.        Left eye: No discharge.  Conjunctiva/sclera: Conjunctivae normal.  Pulmonary:     Effort: Pulmonary effort is normal.  Musculoskeletal:     Comments: No nail involvement.  There is some ecchymosis and mild swelling between the left fourth PIP and DIP.  Patient does have range of motion there as well.  Skin:    General: Skin is warm and dry.     Findings: No rash.  Neurological:     General: No focal deficit present.     Mental Status: She is alert.  Psychiatric:        Mood and Affect: Mood normal.        Behavior: Behavior normal.     ED Results / Procedures / Treatments   Labs (all labs ordered are listed, but only abnormal results are displayed) Labs Reviewed - No data to display  EKG None  Radiology DG Finger Ring Left Result Date: 06/09/2023 CLINICAL  DATA:  Injury, pain EXAM: LEFT RING FINGER 2+V COMPARISON:  None Available. FINDINGS: Normal alignment without acute osseous finding or fracture. No significant joint abnormality or arthropathy. Mild soft tissue swelling. IMPRESSION: Soft tissue swelling without acute osseous finding. Electronically Signed   By: Melven Stable.  Shick M.D.   On: 06/09/2023 18:11    Procedures Procedures    Medications Ordered in ED Medications - No data to display  ED Course/ Medical Decision Making/ A&P   {   Click here for ABCD2, HEART and other calculators  Medical Decision Making Samantha Pope is a 63 y.o. female patient who presents to the emergency department today for further evaluation of left fourth finger injury.  Offered patient splint which she denied.  I reviewed the x-ray which is normal.  Offered patient pain medication which she declined.  She states she will take it when she gets home.  I went over conservative measures with her at the bedside including ice and heat.  I also went over appropriate NSAID regiment.  Strict turn precaution were discussed.  She is safe for discharge.   Amount and/or Complexity of Data Reviewed Radiology: ordered.    Final Clinical Impression(s) / ED Diagnoses Final diagnoses:  Injury of finger of left hand, initial encounter    Rx / DC Orders ED Discharge Orders     None         Darletta Ehrich, PA-C 06/09/23 1954    Sallyanne Creamer, DO 06/12/23 [phone]

## 2023-06-09 NOTE — Discharge Instructions (Signed)
 Please use ice over the area for 20 minutes on and 20 minutes off for the next 3 days.  You can then switch to heat.  You can take 600 mg of ibuprofen every 6 hours as needed for pain.  You can stack on Tylenol the 2 to 3-hour mark.  Please follow-up with your primary care doctor for further evaluation.  You may return to the emergency department for any worsening symptoms.

## 2023-07-31 IMAGING — MG MM BREAST LOCALIZATION CLIP
4 series · 4 of 12 positions shown · non-contrast
Comparison: Previous exam(s).

CLINICAL DATA: Status post stereotactic biopsy for indeterminate
calcifications within the lower central LEFT breast.

EXAM:
3D DIAGNOSTIC LEFT MAMMOGRAM POST STEREOTACTIC BIOPSY

[L LM synth-2D]
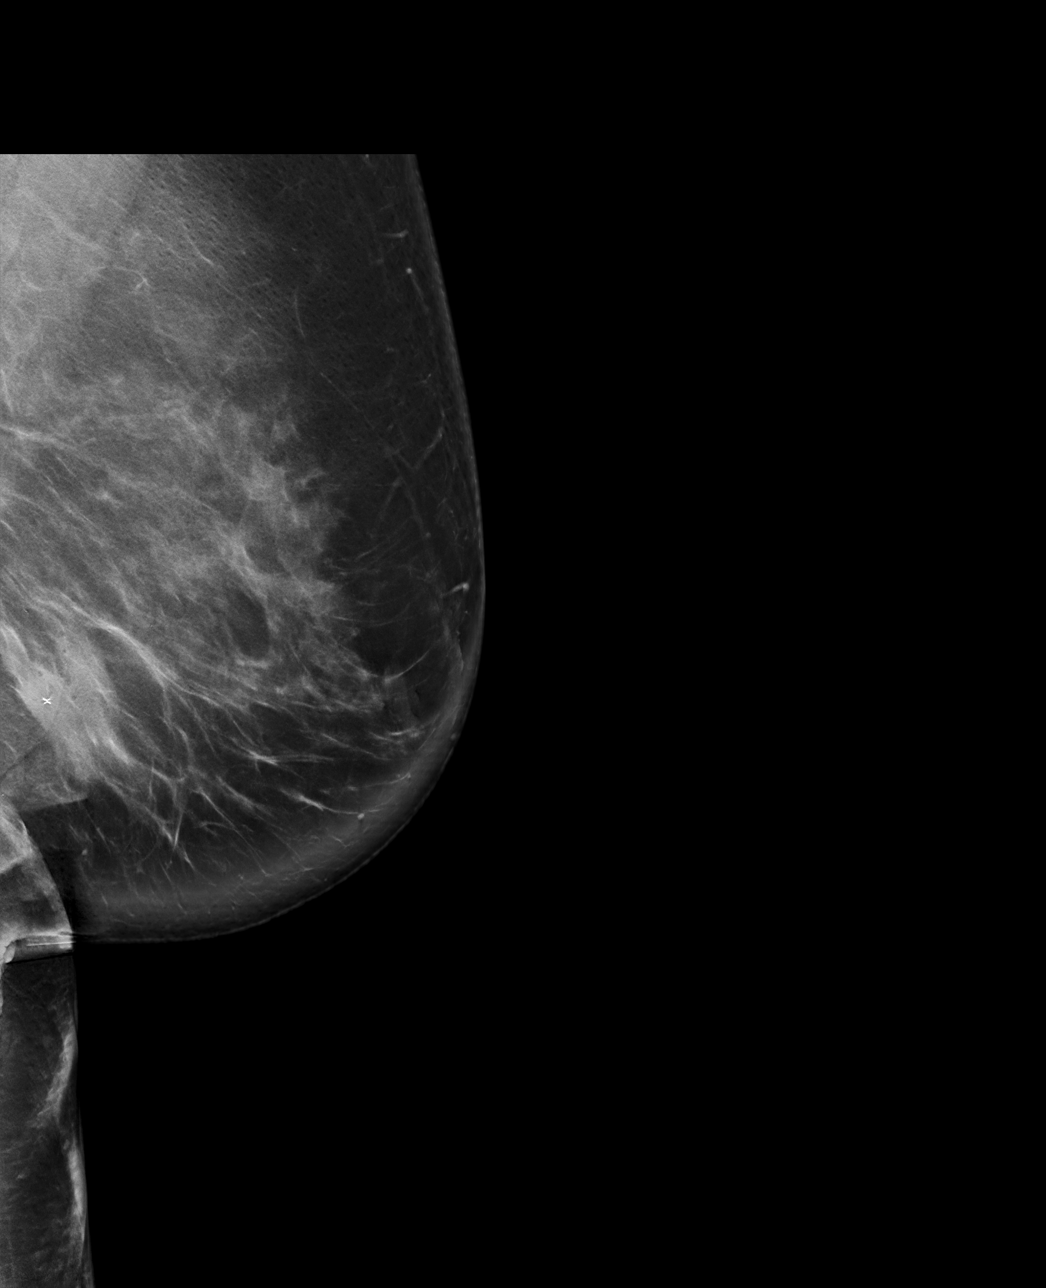

[L CC synth-2D]
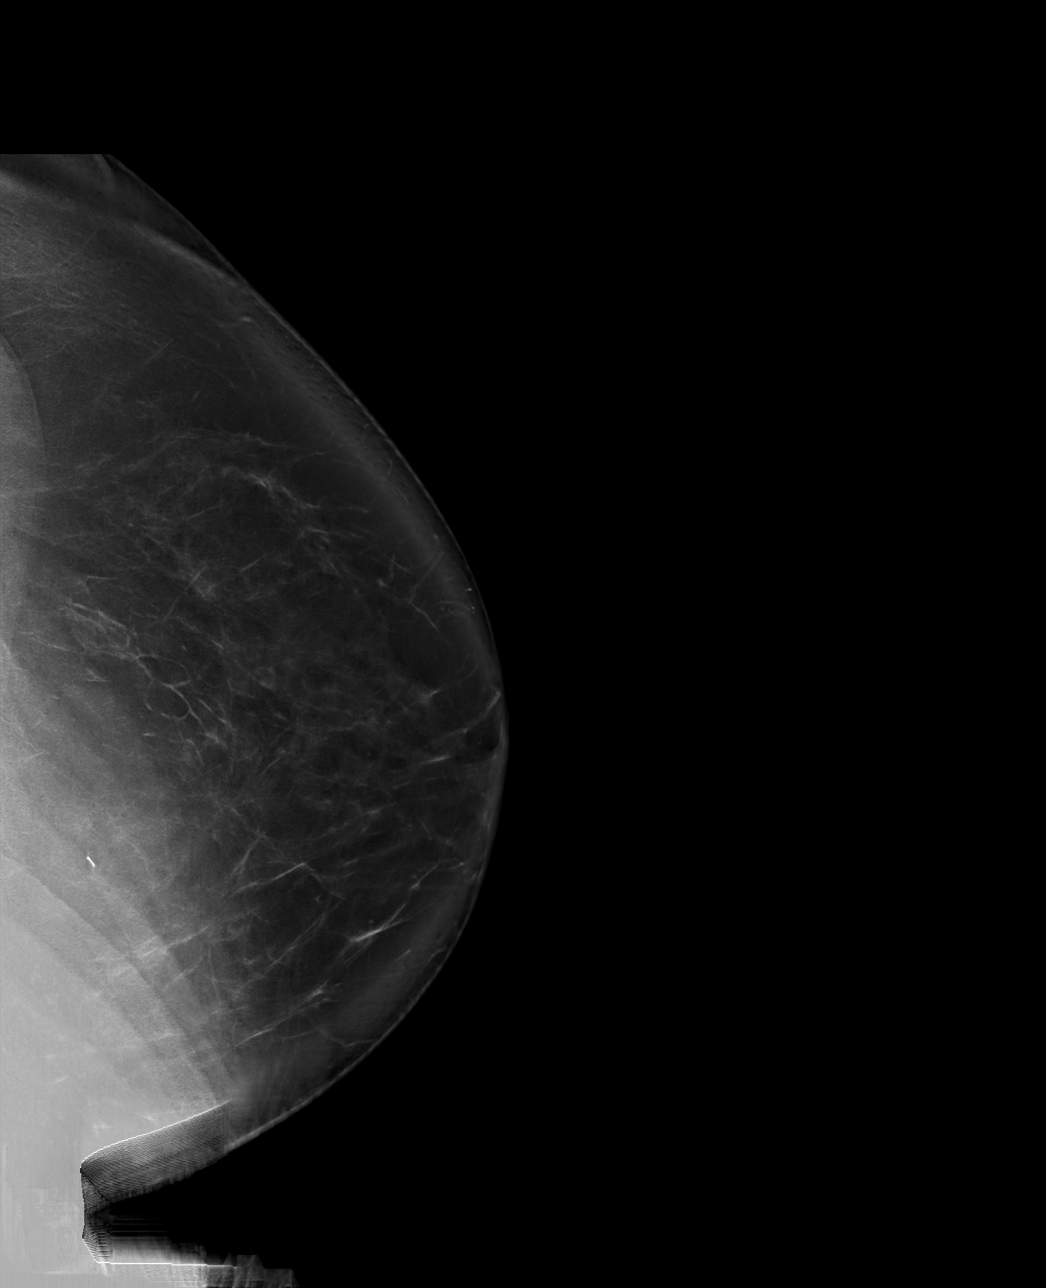

[L LM tomo · tomo slice 57/113.0]
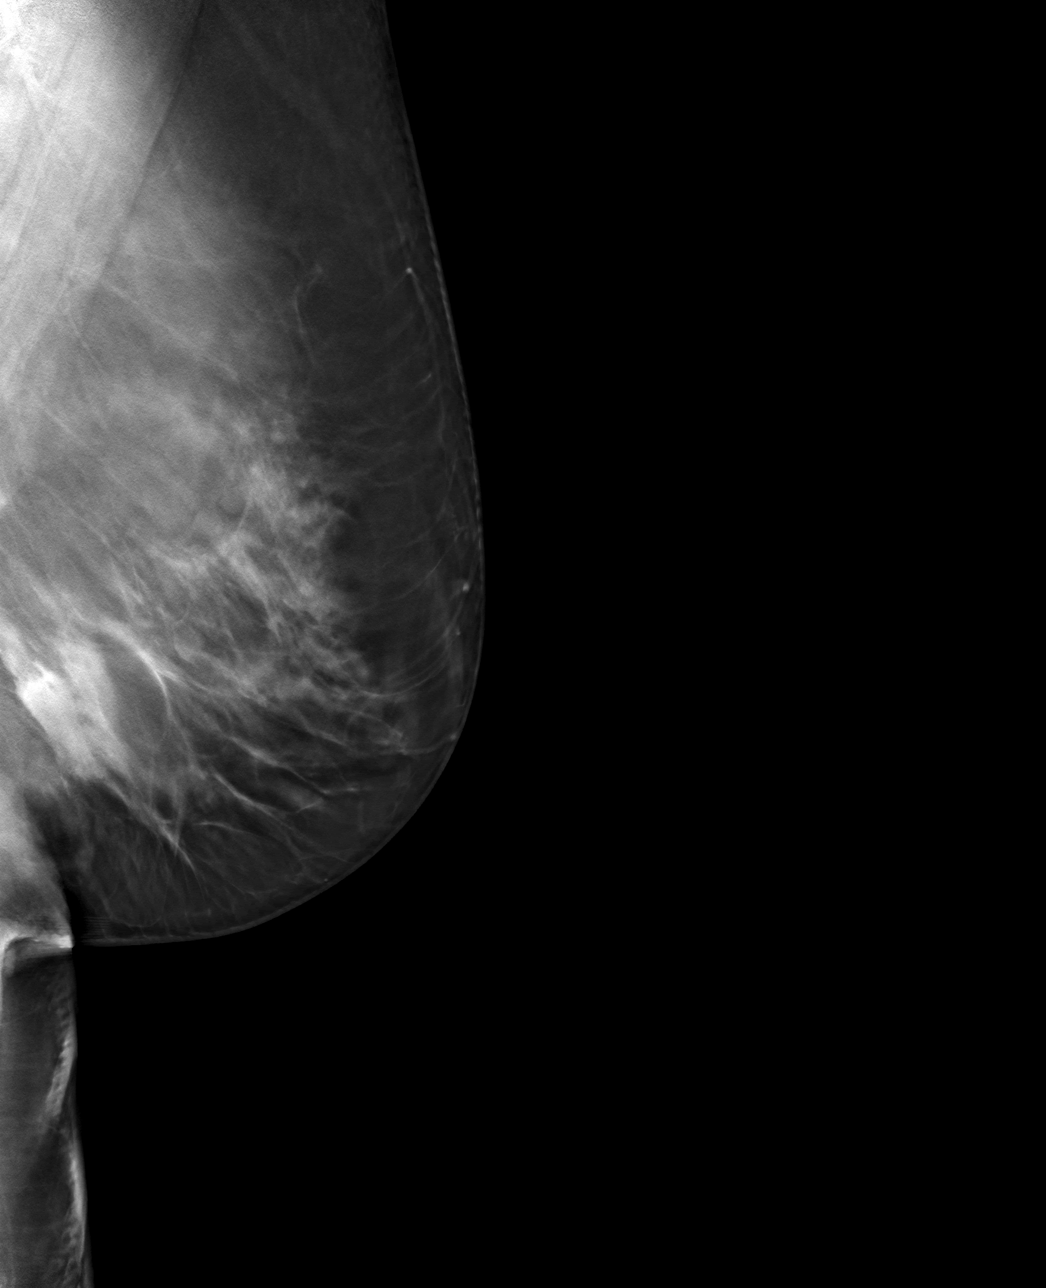

[L CC tomo · tomo slice 55/109.0]
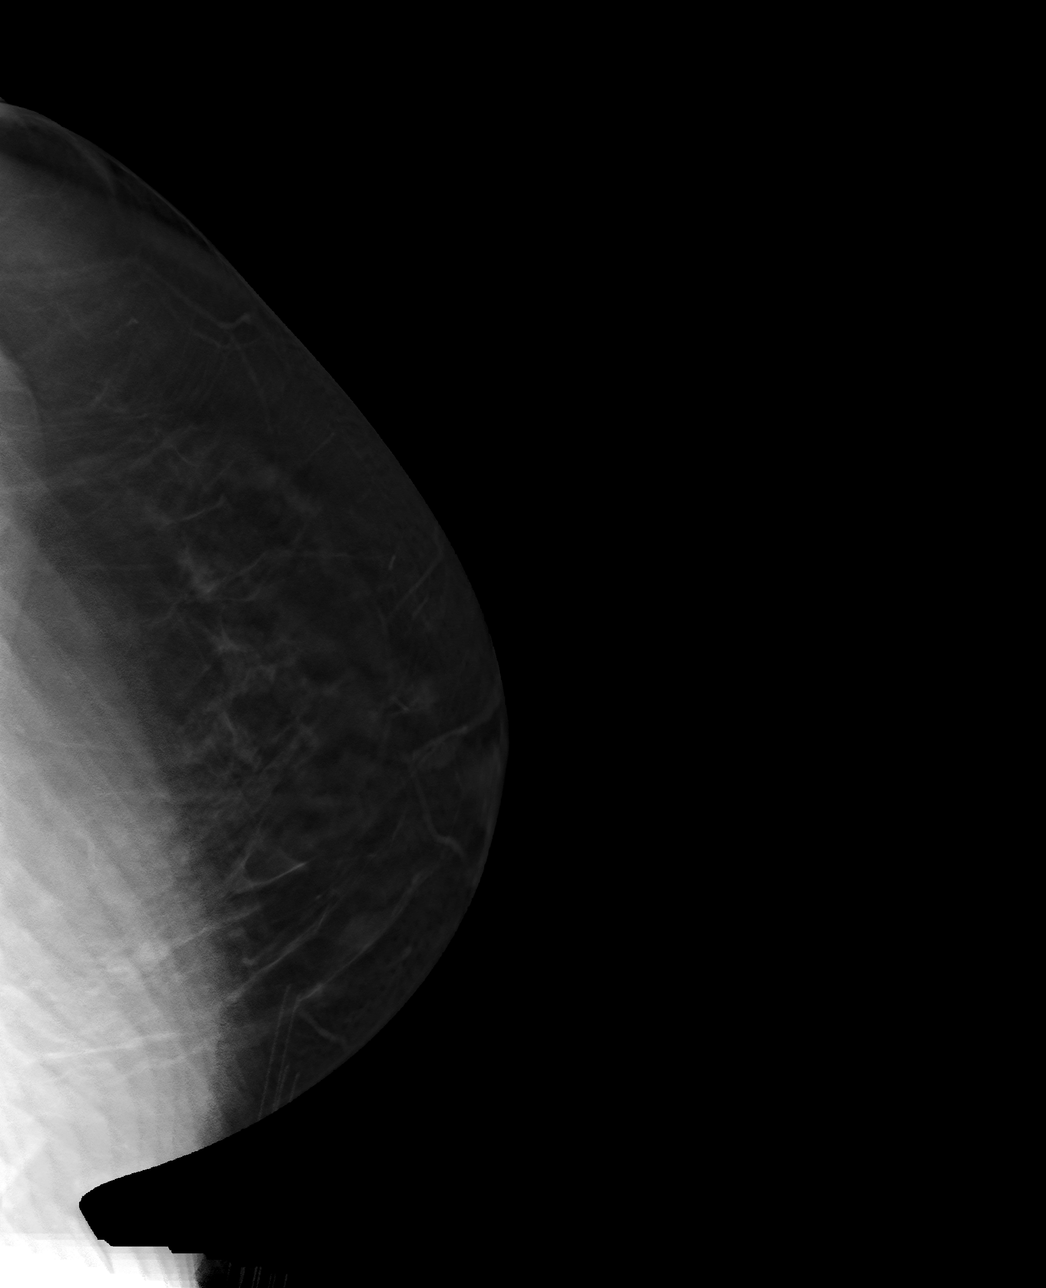

[4 of 12 positions shown; findings below may reference images not displayed]

FINDINGS: 3D Mammographic images were obtained following stereotactic guided
biopsy of indeterminate calcifications within the lower central LEFT
breast. The biopsy marking clip is in expected position at the site
of biopsy.
IMPRESSION: Appropriate positioning of the X shaped biopsy marking clip at the
site of biopsy in the lower central LEFT breast.

Final Assessment: Post Procedure Mammograms for Marker Placement

## 2023-11-02 ENCOUNTER — Other Ambulatory Visit: Payer: Self-pay | Admitting: Internal Medicine

## 2023-11-02 DIAGNOSIS — Z1231 Encounter for screening mammogram for malignant neoplasm of breast: Secondary | ICD-10-CM

## 2023-12-15 ENCOUNTER — Ambulatory Visit
Admission: RE | Admit: 2023-12-15 | Discharge: 2023-12-15 | Disposition: A | Source: Ambulatory Visit | Attending: Internal Medicine | Admitting: Internal Medicine

## 2023-12-15 DIAGNOSIS — Z1231 Encounter for screening mammogram for malignant neoplasm of breast: Secondary | ICD-10-CM
